# Patient Record
Sex: Male | Born: 1945 | Race: White | Hispanic: No | Marital: Married | Smoking: Former smoker
Health system: Southern US, Community
[De-identification: ages and names within clinical notes are randomized; demographics above are authoritative.]

## PROBLEM LIST (undated history)

## (undated) DIAGNOSIS — K5900 Constipation, unspecified: Secondary | ICD-10-CM

## (undated) DIAGNOSIS — K449 Diaphragmatic hernia without obstruction or gangrene: Secondary | ICD-10-CM

## (undated) DIAGNOSIS — L918 Other hypertrophic disorders of the skin: Secondary | ICD-10-CM

## (undated) DIAGNOSIS — E785 Hyperlipidemia, unspecified: Secondary | ICD-10-CM

## (undated) DIAGNOSIS — G4733 Obstructive sleep apnea (adult) (pediatric): Secondary | ICD-10-CM

## (undated) DIAGNOSIS — I10 Essential (primary) hypertension: Secondary | ICD-10-CM

## (undated) DIAGNOSIS — N400 Enlarged prostate without lower urinary tract symptoms: Secondary | ICD-10-CM

## (undated) DIAGNOSIS — R7303 Prediabetes: Secondary | ICD-10-CM

## (undated) DIAGNOSIS — M1710 Unilateral primary osteoarthritis, unspecified knee: Secondary | ICD-10-CM

## (undated) DIAGNOSIS — E118 Type 2 diabetes mellitus with unspecified complications: Secondary | ICD-10-CM

## (undated) DIAGNOSIS — R269 Unspecified abnormalities of gait and mobility: Principal | ICD-10-CM

## (undated) DIAGNOSIS — M545 Low back pain: Secondary | ICD-10-CM

## (undated) DIAGNOSIS — R270 Ataxia, unspecified: Secondary | ICD-10-CM

## (undated) DIAGNOSIS — K648 Other hemorrhoids: Secondary | ICD-10-CM

## (undated) DIAGNOSIS — M1990 Unspecified osteoarthritis, unspecified site: Secondary | ICD-10-CM

## (undated) DIAGNOSIS — L84 Corns and callosities: Secondary | ICD-10-CM

## (undated) DIAGNOSIS — F411 Generalized anxiety disorder: Secondary | ICD-10-CM

## (undated) DIAGNOSIS — R413 Other amnesia: Secondary | ICD-10-CM

## (undated) DIAGNOSIS — D179 Benign lipomatous neoplasm, unspecified: Secondary | ICD-10-CM

## (undated) DIAGNOSIS — B079 Viral wart, unspecified: Secondary | ICD-10-CM

## (undated) DIAGNOSIS — IMO0002 Reserved for concepts with insufficient information to code with codable children: Secondary | ICD-10-CM

## (undated) HISTORY — DX: Low back pain: M54.5

## (undated) HISTORY — DX: Constipation, unspecified: K59.00

## (undated) HISTORY — DX: Viral wart, unspecified: B07.9

## (undated) HISTORY — PX: TOTAL KNEE ARTHROPLASTY: SHX125

## (undated) HISTORY — DX: Unspecified osteoarthritis, unspecified site: M19.90

## (undated) HISTORY — DX: Corns and callosities: L84

## (undated) HISTORY — DX: Obstructive sleep apnea (adult) (pediatric): G47.33

## (undated) HISTORY — DX: Essential (primary) hypertension: I10

## (undated) HISTORY — DX: Unilateral primary osteoarthritis, unspecified knee: M17.10

## (undated) HISTORY — DX: Other amnesia: R41.3

## (undated) HISTORY — DX: Type 2 diabetes mellitus with unspecified complications: E11.8

## (undated) HISTORY — DX: Generalized anxiety disorder: F41.1

## (undated) HISTORY — PX: JOINT REPLACEMENT: SHX530

## (undated) HISTORY — DX: Prediabetes: R73.03

## (undated) HISTORY — DX: Reserved for concepts with insufficient information to code with codable children: IMO0002

## (undated) HISTORY — DX: Diaphragmatic hernia without obstruction or gangrene: K44.9

## (undated) HISTORY — DX: Other hemorrhoids: K64.8

## (undated) HISTORY — DX: Unspecified abnormalities of gait and mobility: R26.9

## (undated) HISTORY — DX: Hyperlipidemia, unspecified: E78.5

## (undated) HISTORY — PX: ROTATOR CUFF REPAIR: SHX139

## (undated) HISTORY — DX: Ataxia, unspecified: R27.0

## (undated) HISTORY — DX: Benign lipomatous neoplasm, unspecified: D17.9

## (undated) HISTORY — PX: KNEE SURGERY: SHX244

## (undated) HISTORY — DX: Other hypertrophic disorders of the skin: L91.8

## (undated) HISTORY — DX: Benign prostatic hyperplasia without lower urinary tract symptoms: N40.0

---
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
## 1997-10-23 ENCOUNTER — Encounter: Payer: Self-pay | Admitting: Internal Medicine

## 1998-03-31 ENCOUNTER — Other Ambulatory Visit: Admission: RE | Admit: 1998-03-31 | Discharge: 1998-03-31 | Payer: Self-pay | Admitting: *Deleted

## 1998-06-11 ENCOUNTER — Encounter: Payer: Self-pay | Admitting: Pulmonary Disease

## 1998-06-19 ENCOUNTER — Encounter: Payer: Self-pay | Admitting: Pulmonary Disease

## 1998-12-01 ENCOUNTER — Ambulatory Visit: Admission: RE | Admit: 1998-12-01 | Discharge: 1998-12-01 | Payer: Self-pay | Admitting: Pulmonary Disease

## 1998-12-01 ENCOUNTER — Encounter: Payer: Self-pay | Admitting: Pulmonary Disease

## 1998-12-23 ENCOUNTER — Encounter: Admission: RE | Admit: 1998-12-23 | Discharge: 1999-03-23 | Payer: Self-pay

## 1999-11-23 HISTORY — PX: NASAL SEPTUM SURGERY: SHX37

## 2000-11-30 ENCOUNTER — Encounter: Admission: RE | Admit: 2000-11-30 | Discharge: 2001-02-28 | Payer: Self-pay | Admitting: Psychology

## 2001-11-07 ENCOUNTER — Encounter: Admission: RE | Admit: 2001-11-07 | Discharge: 2002-02-05 | Payer: Self-pay | Admitting: Orthopedic Surgery

## 2002-04-10 ENCOUNTER — Encounter: Payer: Self-pay | Admitting: Orthopedic Surgery

## 2002-04-13 ENCOUNTER — Inpatient Hospital Stay (HOSPITAL_COMMUNITY): Admission: RE | Admit: 2002-04-13 | Discharge: 2002-04-17 | Payer: Self-pay | Admitting: Orthopedic Surgery

## 2003-02-25 ENCOUNTER — Inpatient Hospital Stay (HOSPITAL_COMMUNITY): Admission: RE | Admit: 2003-02-25 | Discharge: 2003-03-01 | Payer: Self-pay | Admitting: Orthopedic Surgery

## 2004-11-22 HISTORY — PX: TESTICLE REMOVAL: SHX68

## 2005-08-27 ENCOUNTER — Ambulatory Visit (HOSPITAL_COMMUNITY): Admission: RE | Admit: 2005-08-27 | Discharge: 2005-08-27 | Payer: Self-pay | Admitting: Urology

## 2005-08-27 ENCOUNTER — Encounter (INDEPENDENT_AMBULATORY_CARE_PROVIDER_SITE_OTHER): Payer: Self-pay | Admitting: Specialist

## 2006-07-14 HISTORY — PX: COLONOSCOPY: SHX174

## 2008-05-22 ENCOUNTER — Emergency Department (HOSPITAL_COMMUNITY): Admission: EM | Admit: 2008-05-22 | Discharge: 2008-05-22 | Payer: Self-pay | Admitting: Emergency Medicine

## 2009-11-03 ENCOUNTER — Telehealth (INDEPENDENT_AMBULATORY_CARE_PROVIDER_SITE_OTHER): Payer: Self-pay | Admitting: *Deleted

## 2009-11-05 DIAGNOSIS — G4733 Obstructive sleep apnea (adult) (pediatric): Secondary | ICD-10-CM | POA: Insufficient documentation

## 2009-11-05 HISTORY — DX: Obstructive sleep apnea (adult) (pediatric): G47.33

## 2009-11-11 ENCOUNTER — Ambulatory Visit: Payer: Self-pay | Admitting: Pulmonary Disease

## 2009-11-11 DIAGNOSIS — M129 Arthropathy, unspecified: Secondary | ICD-10-CM | POA: Insufficient documentation

## 2009-12-09 ENCOUNTER — Ambulatory Visit (HOSPITAL_BASED_OUTPATIENT_CLINIC_OR_DEPARTMENT_OTHER): Admission: RE | Admit: 2009-12-09 | Discharge: 2009-12-09 | Payer: Self-pay | Admitting: Orthopedic Surgery

## 2010-12-24 NOTE — Consult Note (Signed)
Summary: Sleep Med Consult/[facility]  Sleep Med Consult/[facility]   Imported By: Sherian Rein 11/11/2009 13:59:40  _____________________________________________________________________  External Attachment:    Type:   Image     Comment:   External Document

## 2010-12-24 NOTE — Letter (Signed)
Summary: [facility]  [facility]   Imported By: Sherian Rein 11/11/2009 13:57:10  _____________________________________________________________________  External Attachment:    Type:   Image     Comment:   External Document

## 2010-12-24 NOTE — Assessment & Plan Note (Signed)
Summary: rov for osa   Copy to:  Former Pt Primary Provider/Referring Provider:  Murray Hodgkins  CC:  Sleep Consult.  History of Present Illness: The pt comes in today for f/u of his known osa.  He has not been seen since 1999, but has remained compliant on his cpap.  He got a new cpap machine approx. 4-27yrs ago.  He has kept up with his mask changes, but is due for a new mask.  He is sleeping 8-9hrs a day with cpap, and awakens refreshed.  He is satisfied with his daytime alertness.  His weight has remained neutral since his last visit.  Preventive Screening-Counseling & Management  Alcohol-Tobacco     Smoking Status: quit  Current Medications (verified): 1)  No Medications  Allergies (verified): 1)  ! Codeine  Past History:  Past Medical History: Current Problems:  ARTHRITIS (ICD-716.90) OBSTRUCTIVE SLEEP APNEA (ICD-327.23)    Family History: allergies: sister asthma: sister heart disease: mother, brother, paternal grandmother clotting disorders: maternal grandmother (stroke) cancer: sister (breast), mother (breast), paternal grandmother (breast)   Social History: Patient states former smoker.  started at age 82.  2 ppd.  quit 1966. pt is married. pt has children. pt is retired.  prev worked for the [employer] x 37 years.  Smoking Status:  quit  Review of Systems       The patient complains of anxiety, hand/feet swelling, and joint stiffness or pain.  The patient denies shortness of breath with activity, shortness of breath at rest, productive cough, non-productive cough, coughing up blood, chest pain, irregular heartbeats, acid heartburn, indigestion, loss of appetite, weight change, abdominal pain, difficulty swallowing, sore throat, tooth/dental problems, headaches, nasal congestion/difficulty breathing through nose, sneezing, itching, ear ache, depression, rash, change in color of mucus, and fever.    Vital Signs:  Patient profile:   65 year old male Height:       66 inches Weight:      250.13 pounds BMI:     40.52 O2 Sat:      98 % on Room air Temp:     97.9 degrees F oral Pulse rate:   85 / minute BP sitting:   122 / 84  (right arm) Cuff size:   large  Vitals Entered By: Arman Filter LPN (November 11, 2009 11:28 AM)  O2 Flow:  Room air CC: Sleep Consult Comments Medications reviewed with patient Arman Filter LPN  November 11, 2009 11:41 AM    Physical Exam  General:  ow male in nad Nose:  no skin breakdown or pressure necrosis from cpap mask Extremities:  minimal edema, but no cyanosis Neurologic:  alert, does not appear sleepy, and moves all 4.   Impression & Recommendations:  Problem # 1:  OBSTRUCTIVE SLEEP APNEA (ICD-327.23) The pt has a history of moderate osa, and is doing well on cpap.  I have stressed to him the importance of keeping up with supplies and mask changes, and he will be due for a new machine in next 1-2 years.  I have also reminded him of the importance of weight loss.  Medications Added to Medication List This Visit: 1)  No Medications   Other Orders: Est. Patient Level III (62130)  Patient Instructions: 1)  no change in cpap 2)  work on weight loss 3)  followup with me in 12mos.

## 2011-02-07 LAB — POCT HEMOGLOBIN-HEMACUE: Hemoglobin: 15 g/dL (ref 13.0–17.0)

## 2011-03-10 ENCOUNTER — Encounter: Payer: Self-pay | Admitting: Pulmonary Disease

## 2011-03-12 ENCOUNTER — Ambulatory Visit (INDEPENDENT_AMBULATORY_CARE_PROVIDER_SITE_OTHER): Payer: PRIVATE HEALTH INSURANCE | Admitting: Pulmonary Disease

## 2011-03-12 ENCOUNTER — Encounter: Payer: Self-pay | Admitting: Pulmonary Disease

## 2011-03-12 VITALS — BP 120/80 | HR 74 | Temp 98.0°F | Ht 66.0 in | Wt 252.0 lb

## 2011-03-12 DIAGNOSIS — G4733 Obstructive sleep apnea (adult) (pediatric): Secondary | ICD-10-CM

## 2011-03-12 NOTE — Progress Notes (Signed)
  Subjective:    Patient ID: Jacob Heath, male    DOB: 1946/04/20, 65 y.o.   MRN: [id number]  HPI The pt comes in today for f/u of his known osa.  He is wearing cpap compliantly, and has no issues with his mask fit or pressure.  He is due for a new mask and headgear.  He is sleeping well, and denies EDS.  His weight is stable since last visit.    Review of Systems  Constitutional: Negative for fever and unexpected weight change.  HENT: Positive for rhinorrhea, sneezing and sinus pressure. Negative for ear pain, nosebleeds, congestion, sore throat, trouble swallowing, dental problem and postnasal drip.   Eyes: Negative for redness and itching.  Respiratory: Positive for cough. Negative for chest tightness, shortness of breath and wheezing.   Cardiovascular: Negative for palpitations and leg swelling.  Gastrointestinal: Negative for nausea and vomiting.  Genitourinary: Negative for dysuria.  Musculoskeletal: Positive for joint swelling.  Skin: Negative for rash.  Neurological: Negative for headaches.  Hematological: Does not bruise/bleed easily.  Psychiatric/Behavioral: Negative for dysphoric mood. The patient is nervous/anxious.        Objective:   Physical Exam Obese male in nad No skin breakdown or pressure necrosis from cpap mask  No LE edema, no cyanosis  Alert and oriented, moves all 4  Does not appear sleepy       Assessment & Plan:

## 2011-03-12 NOTE — Patient Instructions (Signed)
Will get you new mask and headgear Work on weight loss followup with me in one year.

## 2011-03-12 NOTE — Assessment & Plan Note (Signed)
The pt is doing very well with cpap, and feels he is sleeping well with good daytime alertness.  He is due for new mask and supplies.  I have also encouraged him to work on weight loss.  He will see me back in one year.

## 2011-04-09 NOTE — Discharge Summary (Signed)
[place]  Patient:    Jacob Heath, Jacob Heath Visit Number: [id number] MRN: [id number]          Service Type: SUR Location: 5000 5029 01 Attending Physician:  Twana First Dictated by:   Julien Girt, P.A. Admit Date:  04/13/2002 Discharge Date: 04/17/2002                             Discharge Summary  ADMITTING DIAGNOSES: 1. End-stage degenerative joint disease, left knee. 2. Sleep apnea.  DISCHARGE DIAGNOSES: 1. End-stage degenerative joint disease, left knee. 2. Sleep apnea.  HISTORY OF PRESENT ILLNESS:  The patient is a 65 year old male with a history of end-stage DJD of his left knee.  He has tried anti-inflammatories, pain medicine and cortisone injections, all without success.  At this point in time, he has pain at night, pain with rest, pain with any activity.  He understands the risks, benefits and possible complications of a left total knee replacement.  PROCEDURES IN HOUSE:  On Apr 13, 2002, the patient underwent a left total knee replacement by Dr. Elana Alm. Wainer; he tolerated the procedure well.  HOSPITAL COURSE:  He was admitted postoperatively for pain control, physical therapy and DVT prophylaxis.  Postop day 0, Coumadin was started.  Postop day 1, surgical wound was well-approximated.  he had a moderate amount of drainage.  Hemoglobin was 11.0, INR was 1.1.  He was metabolically stable. T-max was 100.3.  His Foley was discontinued.  His drain was discontinued. Dressing was changed.  His PCA was discontinued and Mepergan was started. Postop day 2, blood pressure 144/76, O2 saturation 96, surgical wound well-approximated, CPM 0 to 75, hemoglobin 10.9, progressing well with physical therapy.  Postop day 3, T-max was 100.6, pulse of 100, hemoglobin 10.3, surgical wound well-approximated, calf was soft, he was metabolically stable.  Postop day 4, patient doing great, no complaints, surgical wound  is well-approximated, he was 0 to 90 on his CPM, minimal assist to supervision and physical therapy.  He was discharged to home in stable condition, weightbearing as tolerated.  DISCHARGE MEDICATIONS:  Mepergan for pain, Coumadin for DVT prophylaxis and Senokot two tablets q.h.s. for constipation and Colace 100 mg twice a day as a stool softener.  FOLLOWUP:  We will see him back in the office in a week and a half for staples out and an x-ray.  DIET:  He is on a regular diet.  ACTIVITY:  Weightbearing as tolerated.  SPECIAL DISCHARGE INSTRUCTIONS:  Will call us with fever of greater than 101, warmth, redness or increased pain. Dictated by:   Julien Girt, P.A. Attending Physician:  Twana First DD:  05/01/02 TD:  05/03/02 Job: [id number]

## 2011-04-09 NOTE — Op Note (Signed)
[place]  Patient:    Jacob Heath, Jacob Heath Visit Number: [id number] MRN: [id number]          Service Type: SUR Location: 5000 5029 01 Attending Physician:  Twana First Dictated by:   Elana Alm Thurston Hole, M.D. Proc. Date: 04/13/02 Admit Date:  04/13/2002                             Operative Report  PREOPERATIVE DIAGNOSIS: Left knee degenerative joint disease.  POSTOPERATIVE DIAGNOSIS: Left knee degenerative joint disease.  OPERATION/PROCEDURE:  1. Left total knee replacement using [employer] Scorpio total knee system with     #9 cemented component, #9 cemented tibial component, with 15 mm     polyethylene tibial spacer and 26 mm polyethylene cemented patella.  2. Left knee lateral retinacular release.  SURGEON: Elana Alm. Thurston Hole, M.D.  ASSISTANT: Julien Girt, P.A.  ANESTHESIA: General.  OPERATIVE TIME: One hour and 45 minutes.  COMPLICATIONS: None.  DESCRIPTION OF PROCEDURE: Mr. Goldammer was brought to the operating room on Apr 13, 2002 and placed on the operating room table in the supine position.  After an adequate level of general anesthesia was obtained his left knee was examined under anesthesia.  Range of motion was from 0-125 degrees with moderate varus deformity, knee stable ligamentous examination with normal patella tracking.  He had a Foley catheter placed under sterile conditions and received Ancef 1 g preoperatively for prophylaxis.  The left leg was prepped using sterile DuraPrep and draped using sterile technique.  The leg was exsanguinated and a tourniquet elevated to 375 mm Hg.  Initially through a 20 cm longitudinal incision based over the patella initial exposure was made. The underlying subcutaneous tissues were incised in line with the skin incision.  A medial arthrotomy was performed revealing an excessive amount of normal appearing joint fluid.  The articular surfaces were inspected.  He had grade IV changes  medially and grade III and IV changes laterally and in the patellofemoral joint.  Osteophytes were removed from the femoral condyles and tibial plateau.  The medial and lateral meniscal remnants were removed as well as the anterior cruciate ligament.  An intramedullary drill was then drilled up the proximal femoral canal and up the distal femoral canal for placement of the distal femoral cutting jig, which was placed in the appropriate amount of rotation, and then a distal 12 mm cut was made.  The distal femur was incised. A #9 was felt to be the appropriate size and a #9 cutting jig placed and then these cuts were made.  After this was done the proximal tibia was exposed. The tibial spines were removed with an oscillating saw.  The intramedullary drill was drilled down the tibial canal for placement of the proximal tibial cutting jig, which was placed in the appropriate amount of rotation and a proximal 4 mm cut was made based off the medial or lower side.  After this was done then the Scorpio PCL cutter was placed back on the distal femur and these cuts were made.  The #9 femoral trial was then placed and hammered into position.  The #9 tibial base plate with a 15 mm polyethylene spacer was placed and the knee taken through range of motion 0-120 degrees with excellent stability and excellent restoration of normal alignment, and no lift-off on the tray.  The tibial base plate was then locked for rotation and then the [facility] cut  was made.  After this was done then the patella was sized.  A 26 mm was found to be the appropriate size and a recessed 10 mm x 26 mm cut was made and three locking holes placed.  After this was done it was felt that all trial components were of excellent size, fit, and stability.  The knee was then gently lavage irrigated with 3 liters of saline solution.  The proximal tibia was then exposed and the #9 tibial base plate with cement backing was hammered into position  with an excellent fit, with excess cement being removed from around the edges.  The #9 femoral component with cement backing was hammered into position, also with an excellent fit, with excess cement being removed from the around the edges.  The 15 mm polyethylene spacer was locked onto the tibial base plate and the knee taken through range of motion 0-120 degrees with excellent stability and excellent alignment, and no lift-off on the tray.  The 26 mm polyethylene patella with cement backing was then placed into each recessed hole and held there with a clamp.  After the cement hardened patellofemoral tracking was evaluated.  There was some excessive lateral tightness to the patella tracking and thus a Altace retinacular release was carried out, improving this and improving patella tracking to normal.  At this point it was felt that all the components were of excellent size, fit, and stability.  The knee was irrigated with antibiotic solution and then the arthrotomy was closed with #1 Ethibond suture over two medium Hemovac drains.  Subcutaneous tissue was closed with 0 and 2-0 Vicryl.  Skin was closed with skin staples.  Sterile dressings were applied.  The Hemovac was injected with 0.25% Marcaine with epinephrine and clamped.  The tourniquet was released.  The patient then had a femoral nerve block placed by anesthesia for postoperative pain control.  He was then awakened and taken to the recovery room in stable condition.  Needle and sponge counts were correct x2 at the end of the case.  Neurovascular status and pulses were 2+ and symmetric. Dictated by:   Elana Alm Thurston Hole, M.D. Attending Physician:  Twana First DD:  04/13/02 TD:  04/14/02 Job: [id number]

## 2011-04-09 NOTE — Discharge Summary (Signed)
   NAME:  Jacob Heath, Jacob Heath                          ACCOUNT NO.:  [id number]   MEDICAL RECORD NO.:  [id number]                   PATIENT TYPE:  INP   LOCATION:  5039                                 FACILITY:  [facility]   PHYSICIAN:  Robert A. Thurston Hole, M.D.              DATE OF BIRTH:  May 17, 1946   DATE OF ADMISSION:  02/25/2003  DATE OF DISCHARGE:  03/01/2003                                 DISCHARGE SUMMARY   ADMITTING DIAGNOSES:  1. End-stage degenerative joint disease, right knee.  2. Sleep apnea.   DISCHARGE DIAGNOSES:  1. End-stage degenerative joint disease, right knee.  2. Sleep apnea.   PROCEDURES IN HOUSE:  On February 25, 2003, the patient underwent a total knee  replacement by Dr. Elana Alm. Wainer.  He tolerated the procedure well.   HOSPITAL COURSE:  He was admitted postoperatively for pain control, DVT  prophylaxis and physical therapy.  Postop day 1, vital signs are stable, T-  max is 101, hemoglobin was 12.5, BMET was within normal limits, range of  motion was 0 to 50 on the CPM, surgical wound was well-approximated.  His  drain was removed.  CPM was encouraged, as was incentive spirometry.  Postop  day 2, the patient continued to improve without complaint, T-max of 101.3,  hemoglobin was 11.8, surgical wound was well-approximated, he was  neurovascularly intact.  The patient ambulated 25 feet.  Postop day 3, the  patient was ambulating but did lose his balance.  T-max was 101, blood  pressure was 122/56, pulse 102.  We encouraged physical therapy and  continued mobilization.  Postop day 4, the patient was weightbearing as  tolerated, ambulated 100 feet and was discharged to home in stable  condition.   DISCHARGE MEDICATIONS:  1. Coumadin 5 mg one p.o. daily.  2. Mepergan Fortis one to two q.4-6h. p.r.n. pain.   FOLLOWUP:  He will follow up with Dr. Thurston Hole in two weeks.   ACTIVITY AND DIET:  He is weightbearing as tolerated and on a regular diet.     Kirstin  Shepperson, P.A.                  Robert A. Thurston Hole, M.D.    KS/MEDQ  D:  04/17/2003  T:  04/18/2003  Job:  [id number]

## 2011-04-09 NOTE — Op Note (Signed)
Jacob Heath, Jacob Heath                ACCOUNT NO.:  [id number]   MEDICAL RECORD NO.:  [id number]          PATIENT TYPE:  AMB   LOCATION:  DAY                          FACILITY[facility]  [facility]   PHYSICIAN:  Sigmund I. Patsi Sears, M.D.DATE OF BIRTH:  [birth year]   DATE OF PROCEDURE:  08/27/2005  DATE OF DISCHARGE:                                 OPERATIVE REPORT   PREOPERATIVE DIAGNOSIS:  Right intratesticular cyst.   POSTOPERATIVE DIAGNOSIS:  Right intratesticular cyst.   PROCEDURE:  Right orchiectomy.   SURGEON:  Sigmund I. Patsi Sears, M.D.   ASSISTANT:  Glade Nurse, M.D.   ANESTHESIA:  General endotracheal.   SPECIMEN:  Right testis and cord.   DESCRIPTION OF PROCEDURE:  The patient was identified by his wrist bracelet  and brought to room 10 where he received preoperative antibiotics and was  administered general anesthesia. He was prepped and draped in the usual  sterile fashion taking care to minimize peripheral neuropathy and  compartment syndrome. Next, we made a 4 cm right subinguinal incision. We  dissected down through the dermis fat and Scarpa's fascia with Bovie  cautery. Retractors were set, the external ring was identified and the cord  structure was cleared of overlying fat with a Kitner. Next, a right angled  clamp was passed behind the cord structure and the cord was delivered into  the field. A Penrose drain was passed around the cord structure to occlude  and secure the cord structure. Next, by providing upward traction on the  right hemiscrotum, the testis was delivered into the field. The gubernaculum  was divided with sharp dissection and Bovie cautery. Next we dissected off  the attachments to the cremaster as far as the external ring bluntly. The  cord structure was divided into 2 packets using a right angle clamp and each  pack was ligated with a 2-0 Prolene stick tie as well as with 2-0 Prolene  free tie. The cord was divided with heavy scissors and the testis  and cord  was passed off the field for pathologic analysis. A frozen section was  obtained and returned as benign cystic changes. Therefore we did not open  the external ring to remove further cord structure. Next, the wound was  copiously irrigated, hemostasis was ensured. We then closed Scarpa's fascia  with a running 3-0 Vicryl and skin was  closed with a running 4-0 subcuticular Monocryl. Dressings were applied.  Scrotal support was applied. The patient was reversed from his anesthesia  which he tolerated without complication. Please note Sigmund I. Patsi Sears,  M.D. was present and participated in all aspects of this case.     ______________________________  Glade Nurse, MD      Sigmund I. Patsi Sears, M.D.  Electronically Signed    MT/MEDQ  D:  08/27/2005  T:  08/27/2005  Job:  [id number]

## 2011-04-09 NOTE — Op Note (Signed)
NAME:  Jacob Heath, Jacob Heath                          ACCOUNT NO.:  [id number]   MEDICAL RECORD NO.:  [id number]                   PATIENT TYPE:  INP   LOCATION:  NA                                   FACILITY:  [facility]   PHYSICIAN:  Robert A. Thurston Hole, M.D.              DATE OF BIRTH:  1946/02/02   DATE OF PROCEDURE:  02/25/2003  DATE OF DISCHARGE:                                 OPERATIVE REPORT   PREOPERATIVE DIAGNOSIS:  Right knee degenerative joint disease.   POSTOPERATIVE DIAGNOSIS:  Right knee degenerative joint disease.   PROCEDURES:  1. Right total knee replacement using [employer] Scorpio total knee system     with #9 cemented femoral component, #7 cemented tibial component with 15     mm polyethylene tibial spacer, and 26 mm polyethylene cemented patella.  2. Right knee lateral retinacular release.   SURGEON:  Elana Alm. Thurston Hole, M.D.   ASSISTANT:  Julien Girt, P.A.   ANESTHESIA:  General.   OPERATIVE TIME:  1 hour and 20 minutes.   COMPLICATIONS:  None.   DESCRIPTION OF PROCEDURE:  Mr. Bua was brought to the operating room on  02/25/03 and placed on the operative table in the supine position.  After an  adequate level of general anesthesia was obtained, he had a Foley catheter  placed under sterile conditions and received Ancef 1 gm IV preoperatively  for prophylaxis.  His right knee was examined under anesthesia.  Range of  motion from 0 to 120 degrees with mild varus deformity.  Knee stable during  this exam.  The right knee and leg was prepped using sterile DuraPrep and  draped using sterile technique.  Leg was exsanguinated with thigh tourniquet  elevated to 375 mm.  Initially, a 20 cm longitudinal incision placed over  the patella.  Initial exposure was made.  The underlying subcutaneous  tissues were incised, along with skin incision.  A median arthrotomy was  performed revealing an excessive amount of normal-appearing joint fluid.  The articular substances were  inspected.  He had grade 4 changes medially  and grade 3 changes laterally; grade 3 and 4 changes in the patellofemoral  joint.  Osteophytes were removed from the femoral condyles and tibial  plateau.  The medial and lateral meniscal remnants and the anterior cruciate  ligament was removed, as well.  An intramedullary drill was then drilled up  the femoral canal for placement of the distal femoral cutting jig which was  placed in the appropriate amount of rotation and a distal 10 mm cut was  made.  The distal femur was sized.  A #9 was found to be the appropriate  size, and a #9 cutting jig was placed, and these cuts were made.  After this  was done, the proximal tibia was exposed.  The tibial spines were moved with  an oscillating saw.  The intramedullary drill drilled down the tibial  canal  for placement of the proximal tibial cutting jig, and a 6 mm cut was made  off the proximal medial side.  After this cut was made, then the Scorpio PCL  cutter was placed back on the distal femur and these cuts were made.  At  this point, the femoral #9 trial was placed.  The #7 tibial base plate trial  was placed, and with a 15 mm polyethylene spacer, the knee was taken through  a full range of motion and found to be stable, and the alignment corrected  to normal.  The tibial base plate was then marked for rotation, and a keel  cut was made.  After this was done, the patella was sized; 26 mm was found  to be the appropriate size, and the recessed 10 mm x 26 mm cut was made and  the three locking holes were placed.  After this was done, it was felt that  all the trial components were of excellent size, fit, and stability, and  they were then removed.  The knee was jet lavage irrigated with three liters  of saline solution.  The proximal tibial was then exposed, and the #7 tibial  base plate with cement backing was hammered into position with an excellent  fit with excess cement being removed from around  the edges.  The #9 femoral  component with cement backing was hammered into position also with an  excellent fit with excess cement being removed from around the edges.  The  15 mm polyethylene flexed tibial spacer was locked onto the tibial base  plate.  The knee was taken through a full range of motion and found to be  stable.  The 26 mm patella was locked in this recessed hole with cement  backing as well.  After the cement hardened, patellofemoral tracking was  evaluated.  There still was some significant loud patella tracking; thus  lateral retinacular release was carried out, improving the patella tracking  to normal.  After this was done, it was felt that left lower the components  were of excellent size, fit, and stability.  The knee was further irrigated  with antibiotic solution.  The arthrotomy was then closed with #1 Ethilon  suture over two medium Hemovac drains.  The subcutaneous tissue was closed  with 0 and 2-0 Vicryl.  The skin was closed with skin staples.  Sterile  dressings were applied.  The Hemovac was injected with 0.25% Marcaine with  epinephrine and 4 mg of morphine and clamped.  The tourniquet was released.  The patient then had a femoral nerve block placed by anesthesia for  postoperative pain control.  He was then awakened, extubated, and taken to  the recovery room in stable condition.  Needle and sponge counts correct x2  at the end of the case.                                               Robert A. Thurston Hole, M.D.    RAW/MEDQ  D:  02/25/2003  T:  02/25/2003  Job:  [id number]

## 2012-03-08 LAB — HM COLONOSCOPY

## 2013-12-28 ENCOUNTER — Encounter: Payer: Self-pay | Admitting: *Deleted

## 2014-01-01 ENCOUNTER — Ambulatory Visit (INDEPENDENT_AMBULATORY_CARE_PROVIDER_SITE_OTHER): Payer: PRIVATE HEALTH INSURANCE | Admitting: Internal Medicine

## 2014-01-01 ENCOUNTER — Encounter: Payer: Self-pay | Admitting: Internal Medicine

## 2014-01-01 VITALS — BP 124/80 | HR 84 | Temp 98.1°F | Ht 66.5 in | Wt 233.0 lb

## 2014-01-01 DIAGNOSIS — K5900 Constipation, unspecified: Secondary | ICD-10-CM | POA: Insufficient documentation

## 2014-01-01 DIAGNOSIS — Z23 Encounter for immunization: Secondary | ICD-10-CM

## 2014-01-01 DIAGNOSIS — F411 Generalized anxiety disorder: Secondary | ICD-10-CM

## 2014-01-01 DIAGNOSIS — M545 Low back pain, unspecified: Secondary | ICD-10-CM

## 2014-01-01 DIAGNOSIS — L918 Other hypertrophic disorders of the skin: Secondary | ICD-10-CM

## 2014-01-01 DIAGNOSIS — L919 Hypertrophic disorder of the skin, unspecified: Secondary | ICD-10-CM

## 2014-01-01 DIAGNOSIS — D179 Benign lipomatous neoplasm, unspecified: Secondary | ICD-10-CM

## 2014-01-01 DIAGNOSIS — G4733 Obstructive sleep apnea (adult) (pediatric): Secondary | ICD-10-CM

## 2014-01-01 DIAGNOSIS — L84 Corns and callosities: Secondary | ICD-10-CM

## 2014-01-01 DIAGNOSIS — I10 Essential (primary) hypertension: Secondary | ICD-10-CM

## 2014-01-01 DIAGNOSIS — R269 Unspecified abnormalities of gait and mobility: Secondary | ICD-10-CM | POA: Insufficient documentation

## 2014-01-01 DIAGNOSIS — R279 Unspecified lack of coordination: Secondary | ICD-10-CM

## 2014-01-01 DIAGNOSIS — R270 Ataxia, unspecified: Secondary | ICD-10-CM

## 2014-01-01 DIAGNOSIS — L909 Atrophic disorder of skin, unspecified: Secondary | ICD-10-CM

## 2014-01-01 DIAGNOSIS — R413 Other amnesia: Secondary | ICD-10-CM | POA: Insufficient documentation

## 2014-01-01 DIAGNOSIS — B079 Viral wart, unspecified: Secondary | ICD-10-CM

## 2014-01-01 HISTORY — DX: Benign lipomatous neoplasm, unspecified: D17.9

## 2014-01-01 HISTORY — DX: Low back pain, unspecified: M54.50

## 2014-01-01 HISTORY — DX: Other hypertrophic disorders of the skin: L91.8

## 2014-01-01 HISTORY — DX: Ataxia, unspecified: R27.0

## 2014-01-01 MED ORDER — ZOSTER VACCINE LIVE 19400 UNT/0.65ML ~~LOC~~ SOLR: 0.6500 mL | Freq: Once | SUBCUTANEOUS | Status: DC

## 2014-01-01 NOTE — Patient Instructions (Signed)
Try Delsym for cough 
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
## 2014-01-02 LAB — CBC WITH DIFFERENTIAL
Basophils Absolute: 0 10*3/uL (ref 0.0–0.2)
Basos: 0 %
Eos: 1 %
Eosinophils Absolute: 0.1 10*3/uL (ref 0.0–0.4)
HCT: 39.9 % (ref 37.5–51.0)
Hemoglobin: 13.5 g/dL (ref 12.6–17.7)
Immature Grans (Abs): 0 10*3/uL (ref 0.0–0.1)
Immature Granulocytes: 0 %
Lymphocytes Absolute: 2.3 10*3/uL (ref 0.7–3.1)
Lymphs: 31 %
MCH: 28.8 pg (ref 26.6–33.0)
MCHC: 33.8 g/dL (ref 31.5–35.7)
MCV: 85 fL (ref 79–97)
Monocytes Absolute: 0.7 10*3/uL (ref 0.1–0.9)
Monocytes: 9 %
Neutrophils Absolute: 4.5 10*3/uL (ref 1.4–7.0)
Neutrophils Relative %: 59 %
Platelets: 312 10*3/uL (ref 150–379)
RBC: 4.69 x10E6/uL (ref 4.14–5.80)
RDW: 15.1 % (ref 12.3–15.4)
WBC: 7.6 10*3/uL (ref 3.4–10.8)

## 2014-01-02 LAB — COMPREHENSIVE METABOLIC PANEL
ALT: 12 IU/L (ref 0–44)
AST: 16 IU/L (ref 0–40)
Albumin/Globulin Ratio: 1.5 (ref 1.1–2.5)
Albumin: 4.4 g/dL (ref 3.6–4.8)
Alkaline Phosphatase: 88 IU/L (ref 39–117)
BUN/Creatinine Ratio: 11 (ref 10–22)
BUN: 11 mg/dL (ref 8–27)
CO2: 23 mmol/L (ref 18–29)
Calcium: 9.3 mg/dL (ref 8.6–10.2)
Chloride: 101 mmol/L (ref 97–108)
Creatinine, Ser: 1 mg/dL (ref 0.76–1.27)
GFR calc Af Amer: 89 mL/min/{1.73_m2} (ref >59)
GFR calc non Af Amer: 77 mL/min/{1.73_m2} (ref >59)
Globulin, Total: 2.9 g/dL (ref 1.5–4.5)
Glucose: 99 mg/dL (ref 65–99)
Potassium: 4.3 mmol/L (ref 3.5–5.2)
Sodium: 140 mmol/L (ref 134–144)
Total Bilirubin: 0.4 mg/dL (ref 0.0–1.2)
Total Protein: 7.3 g/dL (ref 6.0–8.5)

## 2014-01-02 LAB — LIPID PANEL
Chol/HDL Ratio: 4.7 ratio units (ref 0.0–5.0)
Cholesterol, Total: 203 mg/dL — ABNORMAL HIGH (ref 100–199)
HDL: 43 mg/dL (ref >39)
LDL Calculated: 129 mg/dL — ABNORMAL HIGH (ref 0–99)
Triglycerides: 154 mg/dL — ABNORMAL HIGH (ref 0–149)
VLDL Cholesterol Cal: 31 mg/dL (ref 5–40)

## 2014-01-02 LAB — VITAMIN B12: Vitamin B-12: 351 pg/mL (ref 211–946)

## 2014-01-02 LAB — TSH: TSH: 1.88 u[IU]/mL (ref 0.450–4.500)

## 2014-01-03 ENCOUNTER — Encounter: Payer: Self-pay | Admitting: Internal Medicine

## 2014-01-03 DIAGNOSIS — B079 Viral wart, unspecified: Secondary | ICD-10-CM

## 2014-01-03 DIAGNOSIS — I10 Essential (primary) hypertension: Secondary | ICD-10-CM | POA: Insufficient documentation

## 2014-01-03 HISTORY — DX: Viral wart, unspecified: B07.9

## 2014-01-03 HISTORY — DX: Essential (primary) hypertension: I10

## 2014-01-10 ENCOUNTER — Ambulatory Visit (INDEPENDENT_AMBULATORY_CARE_PROVIDER_SITE_OTHER): Payer: PRIVATE HEALTH INSURANCE | Admitting: Neurology

## 2014-01-10 ENCOUNTER — Encounter: Payer: Self-pay | Admitting: Neurology

## 2014-01-10 DIAGNOSIS — R269 Unspecified abnormalities of gait and mobility: Secondary | ICD-10-CM
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
## 2014-01-25 ENCOUNTER — Ambulatory Visit
Admission: RE | Admit: 2014-01-25 | Discharge: 2014-01-25 | Disposition: A | Discharge status: Home / Self Care | Payer: PRIVATE HEALTH INSURANCE | Source: Ambulatory Visit | Attending: Neurology | Admitting: Neurology

## 2014-01-25 DIAGNOSIS — R269 Unspecified abnormalities of gait and mobility: Secondary | ICD-10-CM

## 2014-01-28 ENCOUNTER — Telehealth: Payer: Self-pay | Admitting: Neurology

## 2014-01-28 DIAGNOSIS — M545 Low back pain, unspecified: Secondary | ICD-10-CM

## 2014-01-28 DIAGNOSIS — R269 Unspecified abnormalities of gait and mobility: Secondary | ICD-10-CM

## 2014-01-28 NOTE — Telephone Encounter (Signed)
I have called Jacob Heath., MRI of the brain was essentially normal, MRI of cervical spine showed mild degenerative disc disease, but there was no cord compression, of findings would not explain his gradual worsening gait difficulty, we will proceed with MRI of thoracic, and lumbar spine,

## 2014-01-28 NOTE — Progress Notes (Signed)
Quick Note:  Please call patient, MRI of the brain showed age-related changes ______

## 2014-01-29 NOTE — Progress Notes (Signed)
Quick Note:  Left message with MRI brain results, age related changes, per Dr. Krista Blue. ______

## 2014-01-31 ENCOUNTER — Telehealth: Payer: Self-pay | Admitting: Neurology

## 2014-01-31 ENCOUNTER — Ambulatory Visit: Payer: PRIVATE HEALTH INSURANCE | Admitting: Neurology

## 2014-02-01 NOTE — Telephone Encounter (Signed)
Called patient and spoke with him we are still waiting on approval for MRI we cx his appt with Dr.Yan until he has MRI. Patient will call back and we will get him appt with Dr.Yan to go over MRI results.

## 2014-02-05 ENCOUNTER — Telehealth: Payer: Self-pay | Admitting: Neurology

## 2014-02-05 NOTE — Telephone Encounter (Signed)
Patient calling and is confused about when to schedule his next appointment with Dr. Krista Blue. Please call patient and advise.

## 2014-02-05 NOTE — Telephone Encounter (Signed)
Called to schedule an appt on 03/07/14 with Dr. Krista Blue, because patient is schedule to have MRI done on 02/09/14 and Dr. Krista Blue wanted to wait until the patient had his MRI done. I advised the patient that if he has any other problems, questions or concerns to call the office. Patient verbalized understanding.

## 2014-02-08 ENCOUNTER — Telehealth: Payer: Self-pay | Admitting: Neurology

## 2014-02-08 DIAGNOSIS — R269 Unspecified abnormalities of gait and mobility: Secondary | ICD-10-CM

## 2014-02-08 NOTE — Telephone Encounter (Signed)
Jessica: Yes it is okay to choline Xanax 1 mg as needed 3 tablets for MRI study

## 2014-02-08 NOTE — Telephone Encounter (Signed)
Pt called states he has taken 2 of the alprazolam and only has 1 left, pt wanted to know if he can get 1 more for tomorrow. Please call pt to confirm this matter. Thanks

## 2014-02-08 NOTE — Telephone Encounter (Signed)
Sorry I routed this to Triage and I meant to send it to refill, hopefully it goes to you thanks.

## 2014-02-08 NOTE — Telephone Encounter (Signed)
I spoke with the patient.  He would like to get another Rx for Xanax for MRI.  Says he lives over [place] miles away, so he would like the Rx called into [facility] if possible.  Okay to call in Rx?  Thank you.

## 2014-02-08 NOTE — Telephone Encounter (Signed)
Rx has been called in.  I spoke with the patient.  He is aware.

## 2014-02-09 ENCOUNTER — Ambulatory Visit
Admission: RE | Admit: 2014-02-09 | Discharge: 2014-02-09 | Disposition: A | Discharge status: Home / Self Care | Payer: PRIVATE HEALTH INSURANCE | Source: Ambulatory Visit | Attending: Neurology | Admitting: Neurology

## 2014-02-09 DIAGNOSIS — M545 Low back pain, unspecified: Secondary | ICD-10-CM

## 2014-02-09 DIAGNOSIS — R269 Unspecified abnormalities of gait and mobility: Secondary | ICD-10-CM

## 2014-03-07 ENCOUNTER — Encounter (INDEPENDENT_AMBULATORY_CARE_PROVIDER_SITE_OTHER): Payer: Self-pay

## 2014-03-07 ENCOUNTER — Encounter: Payer: Self-pay | Admitting: Neurology

## 2014-03-07 ENCOUNTER — Ambulatory Visit (INDEPENDENT_AMBULATORY_CARE_PROVIDER_SITE_OTHER): Payer: PRIVATE HEALTH INSURANCE | Admitting: Neurology

## 2014-03-07 VITALS — BP 115/75 | HR 70 | Ht 65.0 in | Wt 242.0 lb

## 2014-03-07 DIAGNOSIS — R269 Unspecified abnormalities of gait and mobility: Secondary | ICD-10-CM

## 2014-03-07 DIAGNOSIS — R1310 Dysphagia, unspecified: Secondary | ICD-10-CM | POA: Insufficient documentation
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
## 2014-03-08 NOTE — Progress Notes (Signed)
Quick Note:  Please call patient for normal laboratory result ______

## 2014-03-11 LAB — HIV ANTIBODY (ROUTINE TESTING W REFLEX)
HIV 1/O/2 Abs-Index Value: <1 (ref <1.00)
HIV-1/HIV-2 Ab: NONREACTIVE

## 2014-03-11 LAB — IFE AND PE, SERUM
Albumin SerPl Elph-Mcnc: 3.9 g/dL (ref 3.2–5.6)
Albumin/Glob SerPl: 1.2 (ref 0.7–2.0)
Alpha 1: 0.2 g/dL (ref 0.1–0.4)
Alpha2 Glob SerPl Elph-Mcnc: 0.8 g/dL (ref 0.4–1.2)
B-Globulin SerPl Elph-Mcnc: 1.2 g/dL (ref 0.6–1.3)
Gamma Glob SerPl Elph-Mcnc: 1.2 g/dL (ref 0.5–1.6)
Globulin, Total: 3.4 g/dL (ref 2.0–4.5)
IgA/Immunoglobulin A, Serum: 377 mg/dL (ref 91–414)
IgG (Immunoglobin G), Serum: 1336 mg/dL (ref 700–1600)
IgM (Immunoglobulin M), Srm: 40 mg/dL (ref 40–230)
Total Protein: 7.3 g/dL (ref 6.0–8.5)

## 2014-03-11 LAB — ANA W/REFLEX IF POSITIVE: Anti Nuclear Antibody(ANA): NEGATIVE

## 2014-03-11 LAB — SEDIMENTATION RATE: Sed Rate: 16 mm/hr (ref 0–30)

## 2014-03-11 LAB — FOLATE: Folate: 17.8 ng/mL (ref >3.0)

## 2014-03-11 LAB — RPR: RPR: NONREACTIVE

## 2014-03-11 LAB — COPPER, SERUM: Copper: 178 ug/dL — ABNORMAL HIGH (ref 72–166)

## 2014-03-11 LAB — LYME, TOTAL AB TEST/REFLEX: Lyme IgG/IgM Ab: <0.91 {ISR} (ref 0.00–0.90)

## 2014-03-11 LAB — CK: Total CK: 76 U/L (ref 24–204)

## 2014-03-19 ENCOUNTER — Ambulatory Visit (INDEPENDENT_AMBULATORY_CARE_PROVIDER_SITE_OTHER): Payer: PRIVATE HEALTH INSURANCE | Admitting: Neurology

## 2014-03-19 ENCOUNTER — Encounter (INDEPENDENT_AMBULATORY_CARE_PROVIDER_SITE_OTHER): Payer: Self-pay

## 2014-03-19 DIAGNOSIS — R269 Unspecified abnormalities of gait and mobility: Secondary | ICD-10-CM

## 2014-03-19 DIAGNOSIS — R270 Ataxia, unspecified: Secondary | ICD-10-CM

## 2014-03-19 DIAGNOSIS — M129 Arthropathy, unspecified: Secondary | ICD-10-CM

## 2014-03-19 DIAGNOSIS — K5900 Constipation, unspecified: Secondary | ICD-10-CM

## 2014-03-19 DIAGNOSIS — F411 Generalized anxiety disorder: Secondary | ICD-10-CM

## 2014-03-19 DIAGNOSIS — R279 Unspecified lack of coordination: Secondary | ICD-10-CM

## 2014-03-19 DIAGNOSIS — R413 Other amnesia: Secondary | ICD-10-CM

## 2014-03-19 DIAGNOSIS — Z0289 Encounter for other administrative examinations: Secondary | ICD-10-CM

## 2014-03-19 DIAGNOSIS — R1310 Dysphagia, unspecified: Secondary | ICD-10-CM

## 2014-03-19 NOTE — Procedures (Signed)
   NCS (NERVE CONDUCTION STUDY) WITH EMG (ELECTROMYOGRAPHY) REPORT   STUDY DATE: April 28th 2015 PATIENT NAME: Jacob Heath DOB: 12/24/1945 MRN: [id number]    TECHNOLOGIST: Laretta Alstrom ELECTROMYOGRAPHER: Marcial Pacas M.D.  CLINICAL INFORMATION:  68 years old right-handed Caucasian male, with history of bilateral knee replacement, presenting with right onset gait difficulty for more than 10 years, getting worse over the past 6 months, also develop mild dysarthria, mild dysphagia. There was no sensory changes. There was no significant weakness on examination, diffusely hyporeflexia. There was no tongue atrophy or weakness  FINDINGS: NERVE CONDUCTION STUDY: Bilateral peroneal sensory responses were normal. Bilateral peroneal to EDB, and tibial motor responses were normal. Bilateral tibial H. reflexes were absent.  Right median, ulnar sensory motor responses were normal.  NEEDLE ELECTROMYOGRAPHY:  Selected needle examination was performed at right lower extremity muscles, right upper extremity muscles, right lumbosacral paraspinals, right cervical paraspinals, right thoracic paraspinals, right sternocleidomastoid, right genioglossus.  Needle examination of right tibialis anterior, tibialis posterior, medial gastrocnemius, vastus lateralis, biceps femoris long head was normal.  There was no spontaneous activity at right lumbosacral paraspinal muscles, right L4, L5, S1  Needle examination of right extensor digitorum communis, biceps, triceps, deltoid, was normal.  There was no spontaneous activity at the right cervical paraspinal muscles, right C5, 6, 7  There was no spontaneous activity at right thoracic paraspinal muscles, right T9, T10, T11.  Needle examination of right sternocleidomastoid, and right genioglossus was normal,  IMPRESSION:   This is essentially a normal examinations. There is no evidence of large fiber neuropathy, there was no evidence of right cervical, right  lumbosacral radiculopathy. There was no evidence of myopathy.   INTERPRETING PHYSICIAN:   Marcial Pacas M.D. Ph.D. [facility] [address], [place], [place] [address] [phone]

## 2014-04-09 ENCOUNTER — Encounter: Payer: Self-pay | Admitting: Internal Medicine

## 2014-04-09 ENCOUNTER — Ambulatory Visit (INDEPENDENT_AMBULATORY_CARE_PROVIDER_SITE_OTHER): Payer: PRIVATE HEALTH INSURANCE | Admitting: Internal Medicine

## 2014-04-09 ENCOUNTER — Ambulatory Visit (HOSPITAL_COMMUNITY): Payer: PRIVATE HEALTH INSURANCE | Admitting: Physical Therapy

## 2014-04-09 VITALS — BP 150/92 | HR 70 | Temp 98.2°F | Wt 241.8 lb

## 2014-04-09 DIAGNOSIS — R270 Ataxia, unspecified: Secondary | ICD-10-CM

## 2014-04-09 DIAGNOSIS — M545 Low back pain, unspecified: Secondary | ICD-10-CM

## 2014-04-09 DIAGNOSIS — R29818 Other symptoms and signs involving the nervous system: Secondary | ICD-10-CM

## 2014-04-09 DIAGNOSIS — R2689 Other abnormalities of gait and mobility: Secondary | ICD-10-CM | POA: Insufficient documentation

## 2014-04-09 DIAGNOSIS — R279 Unspecified lack of coordination: Secondary | ICD-10-CM

## 2014-04-09 DIAGNOSIS — B079 Viral wart, unspecified: Secondary | ICD-10-CM

## 2014-04-09 DIAGNOSIS — R269 Unspecified abnormalities of gait and mobility: Secondary | ICD-10-CM

## 2014-04-09 DIAGNOSIS — M129 Arthropathy, unspecified: Secondary | ICD-10-CM

## 2014-04-09 DIAGNOSIS — Z23 Encounter for immunization: Secondary | ICD-10-CM

## 2014-04-09 DIAGNOSIS — I10 Essential (primary) hypertension: Secondary | ICD-10-CM

## 2014-04-09 MED ORDER — ZOSTER VACCINE LIVE 19400 UNT/0.65ML ~~LOC~~ SOLR: 0.6500 mL | Freq: Once | SUBCUTANEOUS | Status: DC

## 2014-04-09 NOTE — Patient Instructions (Signed)
Consider use of walker 
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
## 2014-04-10 ENCOUNTER — Telehealth: Payer: Self-pay | Admitting: Neurology

## 2014-04-10 ENCOUNTER — Ambulatory Visit (HOSPITAL_COMMUNITY): Payer: PRIVATE HEALTH INSURANCE | Admitting: Physical Therapy

## 2014-04-10 NOTE — Telephone Encounter (Signed)
Rodena Piety with Dr Hervey Ard office, calling to check status of referral to [facility] for further evaluation.  Please call and advise

## 2014-04-11 NOTE — Telephone Encounter (Signed)
Called Rodena Piety back and Left her a message Patients appt at [facility] will be July 20th at 8:45 am with Dr. Jackelyn Poling.

## 2014-04-11 NOTE — Telephone Encounter (Signed)
Called back and spoke to [place] and told her went sent referral to [facility] referral faxed on 03-21-2014. I will call [place] to follow up on appt and give her a call back.

## 2014-04-16 NOTE — Telephone Encounter (Signed)
Spoke with Patient and informed him of his appointment at [facility]. Patient wanted me to ask Dr. Nyoka Cowden if he thought this could be coming from inner ear problems and I spoke with Dr. Nyoka Cowden and he said No. Patient is aware.

## 2014-04-17 ENCOUNTER — Ambulatory Visit (HOSPITAL_COMMUNITY)
Admission: RE | Admit: 2014-04-17 | Discharge: 2014-04-17 | Disposition: A | Discharge status: Home / Self Care | Payer: PRIVATE HEALTH INSURANCE | Source: Ambulatory Visit | Attending: Neurology | Admitting: Neurology

## 2014-04-17 DIAGNOSIS — R262 Difficulty in walking, not elsewhere classified: Secondary | ICD-10-CM | POA: Insufficient documentation

## 2014-04-17 DIAGNOSIS — Z9181 History of falling: Secondary | ICD-10-CM | POA: Insufficient documentation

## 2014-04-17 DIAGNOSIS — IMO0001 Reserved for inherently not codable concepts without codable children: Secondary | ICD-10-CM | POA: Insufficient documentation
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
## 2014-04-19 ENCOUNTER — Ambulatory Visit (HOSPITAL_COMMUNITY)
Admission: RE | Admit: 2014-04-19 | Discharge: 2014-04-19 | Disposition: A | Discharge status: Home / Self Care | Payer: PRIVATE HEALTH INSURANCE | Source: Ambulatory Visit | Attending: Neurology | Admitting: Neurology
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
## 2014-04-24 ENCOUNTER — Ambulatory Visit (HOSPITAL_COMMUNITY): Payer: PRIVATE HEALTH INSURANCE | Admitting: Physical Therapy

## 2014-04-24 ENCOUNTER — Ambulatory Visit (HOSPITAL_COMMUNITY)
Admission: RE | Admit: 2014-04-24 | Discharge: 2014-04-24 | Disposition: A | Discharge status: Home / Self Care | Payer: PRIVATE HEALTH INSURANCE | Source: Ambulatory Visit | Attending: Internal Medicine | Admitting: Internal Medicine

## 2014-04-24 DIAGNOSIS — Z9181 History of falling: Secondary | ICD-10-CM | POA: Insufficient documentation

## 2014-04-24 DIAGNOSIS — IMO0001 Reserved for inherently not codable concepts without codable children: Secondary | ICD-10-CM | POA: Insufficient documentation

## 2014-04-24 DIAGNOSIS — R262 Difficulty in walking, not elsewhere classified: Secondary | ICD-10-CM | POA: Insufficient documentation
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
## 2014-04-26 ENCOUNTER — Ambulatory Visit (HOSPITAL_COMMUNITY)
Admission: RE | Admit: 2014-04-26 | Discharge: 2014-04-26 | Disposition: A | Discharge status: Home / Self Care | Payer: PRIVATE HEALTH INSURANCE | Source: Ambulatory Visit | Attending: Neurology | Admitting: Neurology
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
## 2014-05-01 ENCOUNTER — Ambulatory Visit (HOSPITAL_COMMUNITY)
Admission: RE | Admit: 2014-05-01 | Discharge: 2014-05-01 | Disposition: A | Discharge status: Home / Self Care | Payer: PRIVATE HEALTH INSURANCE | Source: Ambulatory Visit | Attending: Internal Medicine | Admitting: Internal Medicine
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
## 2014-05-03 ENCOUNTER — Ambulatory Visit (HOSPITAL_COMMUNITY)
Admission: RE | Admit: 2014-05-03 | Discharge: 2014-05-03 | Disposition: A | Discharge status: Home / Self Care | Payer: PRIVATE HEALTH INSURANCE | Source: Ambulatory Visit | Attending: Internal Medicine | Admitting: Internal Medicine
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
## 2014-05-08 ENCOUNTER — Ambulatory Visit (HOSPITAL_COMMUNITY)
Admission: RE | Admit: 2014-05-08 | Discharge: 2014-05-08 | Disposition: A | Discharge status: Home / Self Care | Payer: PRIVATE HEALTH INSURANCE | Source: Ambulatory Visit | Attending: Internal Medicine | Admitting: Internal Medicine
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
## 2014-05-10 ENCOUNTER — Ambulatory Visit (HOSPITAL_COMMUNITY)
Admission: RE | Admit: 2014-05-10 | Discharge: 2014-05-10 | Disposition: A | Discharge status: Home / Self Care | Payer: PRIVATE HEALTH INSURANCE | Source: Ambulatory Visit | Attending: Internal Medicine | Admitting: Internal Medicine
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
## 2014-05-15 ENCOUNTER — Ambulatory Visit (HOSPITAL_COMMUNITY)
Admission: RE | Admit: 2014-05-15 | Discharge: 2014-05-15 | Disposition: A | Discharge status: Home / Self Care | Payer: PRIVATE HEALTH INSURANCE | Source: Ambulatory Visit | Attending: Internal Medicine | Admitting: Internal Medicine
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
## 2014-05-17 ENCOUNTER — Ambulatory Visit (HOSPITAL_COMMUNITY)
Admission: RE | Admit: 2014-05-17 | Discharge: 2014-05-17 | Disposition: A | Discharge status: Home / Self Care | Payer: PRIVATE HEALTH INSURANCE | Source: Ambulatory Visit | Attending: Internal Medicine | Admitting: Internal Medicine
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
## 2014-05-22 ENCOUNTER — Ambulatory Visit (HOSPITAL_COMMUNITY)
Admission: RE | Admit: 2014-05-22 | Discharge: 2014-05-22 | Disposition: A | Discharge status: Home / Self Care | Payer: PRIVATE HEALTH INSURANCE | Source: Ambulatory Visit | Attending: Internal Medicine | Admitting: Internal Medicine

## 2014-05-22 DIAGNOSIS — Z9181 History of falling: Secondary | ICD-10-CM | POA: Insufficient documentation

## 2014-05-22 DIAGNOSIS — IMO0001 Reserved for inherently not codable concepts without codable children: Secondary | ICD-10-CM | POA: Insufficient documentation

## 2014-05-22 DIAGNOSIS — R262 Difficulty in walking, not elsewhere classified: Secondary | ICD-10-CM | POA: Insufficient documentation
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
## 2014-05-28 ENCOUNTER — Inpatient Hospital Stay (HOSPITAL_COMMUNITY): Admission: RE | Admit: 2014-05-28 | Payer: PRIVATE HEALTH INSURANCE | Source: Ambulatory Visit

## 2014-05-31 ENCOUNTER — Ambulatory Visit (HOSPITAL_COMMUNITY)
Admission: RE | Admit: 2014-05-31 | Discharge: 2014-05-31 | Disposition: A | Discharge status: Home / Self Care | Payer: PRIVATE HEALTH INSURANCE | Source: Ambulatory Visit | Attending: Internal Medicine | Admitting: Internal Medicine
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
## 2014-06-04 ENCOUNTER — Ambulatory Visit (HOSPITAL_COMMUNITY)
Admission: RE | Admit: 2014-06-04 | Discharge: 2014-06-04 | Disposition: A | Discharge status: Home / Self Care | Payer: PRIVATE HEALTH INSURANCE | Source: Ambulatory Visit | Attending: Internal Medicine | Admitting: Internal Medicine
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
## 2014-06-07 ENCOUNTER — Inpatient Hospital Stay (HOSPITAL_COMMUNITY)
Admission: RE | Admit: 2014-06-07 | Payer: PRIVATE HEALTH INSURANCE | Source: Ambulatory Visit | Admitting: Physical Therapy

## 2014-06-11 ENCOUNTER — Ambulatory Visit (HOSPITAL_COMMUNITY)
Admission: RE | Admit: 2014-06-11 | Discharge: 2014-06-11 | Disposition: A | Discharge status: Home / Self Care | Payer: PRIVATE HEALTH INSURANCE | Source: Ambulatory Visit | Attending: Internal Medicine | Admitting: Internal Medicine
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
## 2014-06-14 ENCOUNTER — Ambulatory Visit (HOSPITAL_COMMUNITY): Payer: PRIVATE HEALTH INSURANCE | Admitting: Physical Therapy

## 2014-06-18 ENCOUNTER — Ambulatory Visit (HOSPITAL_COMMUNITY)
Admission: RE | Admit: 2014-06-18 | Discharge: 2014-06-18 | Disposition: A | Discharge status: Home / Self Care | Payer: PRIVATE HEALTH INSURANCE | Source: Ambulatory Visit | Attending: Internal Medicine | Admitting: Internal Medicine
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
## 2014-06-19 ENCOUNTER — Ambulatory Visit (HOSPITAL_COMMUNITY): Payer: PRIVATE HEALTH INSURANCE | Admitting: Physical Therapy

## 2014-06-21 ENCOUNTER — Ambulatory Visit (HOSPITAL_COMMUNITY): Payer: PRIVATE HEALTH INSURANCE

## 2014-07-10 ENCOUNTER — Encounter: Payer: Self-pay | Admitting: Internal Medicine

## 2014-07-10 ENCOUNTER — Ambulatory Visit (INDEPENDENT_AMBULATORY_CARE_PROVIDER_SITE_OTHER): Payer: PRIVATE HEALTH INSURANCE | Admitting: Internal Medicine

## 2014-07-10 VITALS — BP 138/86 | HR 75 | Temp 98.2°F | Ht 65.75 in | Wt 242.6 lb

## 2014-07-10 DIAGNOSIS — R269 Unspecified abnormalities of gait and mobility: Secondary | ICD-10-CM

## 2014-07-10 DIAGNOSIS — K5900 Constipation, unspecified: Secondary | ICD-10-CM

## 2014-07-10 DIAGNOSIS — M545 Low back pain, unspecified: Secondary | ICD-10-CM

## 2014-07-10 DIAGNOSIS — Z9181 History of falling: Secondary | ICD-10-CM

## 2014-07-10 DIAGNOSIS — I10 Essential (primary) hypertension: Secondary | ICD-10-CM

## 2014-07-10 DIAGNOSIS — R279 Unspecified lack of coordination: Secondary | ICD-10-CM

## 2014-07-10 DIAGNOSIS — R2689 Other abnormalities of gait and mobility: Secondary | ICD-10-CM

## 2014-07-10 DIAGNOSIS — R29818 Other symptoms and signs involving the nervous system: Secondary | ICD-10-CM

## 2014-07-10 DIAGNOSIS — R270 Ataxia, unspecified: Secondary | ICD-10-CM
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
## 2014-09-18 ENCOUNTER — Encounter: Payer: Self-pay | Admitting: Neurology

## 2014-09-18 ENCOUNTER — Ambulatory Visit (INDEPENDENT_AMBULATORY_CARE_PROVIDER_SITE_OTHER): Payer: PRIVATE HEALTH INSURANCE | Admitting: Neurology

## 2014-09-18 VITALS — BP 124/74 | HR 61 | Ht 65.0 in | Wt 238.0 lb

## 2014-09-18 DIAGNOSIS — R270 Ataxia, unspecified: Secondary | ICD-10-CM

## 2014-09-18 DIAGNOSIS — R269 Unspecified abnormalities of gait and mobility: Secondary | ICD-10-CM

## 2014-09-18 MED ORDER — BACLOFEN 10 MG PO TABS: 10.0000 mg | ORAL_TABLET | Freq: Three times a day (TID) | ORAL | Status: DC
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
## 2015-01-08 ENCOUNTER — Ambulatory Visit: Payer: PRIVATE HEALTH INSURANCE | Admitting: Internal Medicine

## 2015-01-15 ENCOUNTER — Ambulatory Visit: Payer: PRIVATE HEALTH INSURANCE | Admitting: Internal Medicine

## 2015-01-21 ENCOUNTER — Ambulatory Visit (INDEPENDENT_AMBULATORY_CARE_PROVIDER_SITE_OTHER): Payer: PRIVATE HEALTH INSURANCE | Admitting: Internal Medicine

## 2015-01-21 ENCOUNTER — Encounter: Payer: Self-pay | Admitting: Internal Medicine

## 2015-01-21 VITALS — BP 122/84 | HR 75 | Temp 98.6°F | Resp 18 | Ht 65.0 in | Wt 235.2 lb

## 2015-01-21 DIAGNOSIS — I10 Essential (primary) hypertension: Secondary | ICD-10-CM

## 2015-01-21 DIAGNOSIS — R29818 Other symptoms and signs involving the nervous system: Secondary | ICD-10-CM | POA: Diagnosis not present

## 2015-01-21 DIAGNOSIS — R270 Ataxia, unspecified: Secondary | ICD-10-CM

## 2015-01-21 DIAGNOSIS — Z23 Encounter for immunization: Secondary | ICD-10-CM

## 2015-01-21 DIAGNOSIS — R1310 Dysphagia, unspecified: Secondary | ICD-10-CM | POA: Diagnosis not present

## 2015-01-21 DIAGNOSIS — M25561 Pain in right knee: Secondary | ICD-10-CM | POA: Diagnosis not present

## 2015-01-21 DIAGNOSIS — M25562 Pain in left knee: Secondary | ICD-10-CM

## 2015-01-21 DIAGNOSIS — R269 Unspecified abnormalities of gait and mobility: Secondary | ICD-10-CM

## 2015-01-21 DIAGNOSIS — R2689 Other abnormalities of gait and mobility: Secondary | ICD-10-CM

## 2015-01-21 DIAGNOSIS — R413 Other amnesia: Secondary | ICD-10-CM | POA: Diagnosis not present

## 2015-01-21 NOTE — Patient Instructions (Addendum)
Try the Atkin's Diet or the [employer].  Return for the Prevnar pneumococcal  Vaccine.
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
## 2015-01-22 ENCOUNTER — Ambulatory Visit: Payer: PRIVATE HEALTH INSURANCE | Admitting: Internal Medicine

## 2015-01-22 DIAGNOSIS — M25569 Pain in unspecified knee: Secondary | ICD-10-CM | POA: Insufficient documentation

## 2015-03-20 ENCOUNTER — Ambulatory Visit: Payer: PRIVATE HEALTH INSURANCE | Admitting: Neurology

## 2015-03-21 ENCOUNTER — Ambulatory Visit (INDEPENDENT_AMBULATORY_CARE_PROVIDER_SITE_OTHER): Payer: PRIVATE HEALTH INSURANCE | Admitting: Neurology

## 2015-03-21 ENCOUNTER — Encounter: Payer: Self-pay | Admitting: Neurology

## 2015-03-21 VITALS — BP 129/80 | HR 73 | Ht 65.0 in | Wt 238.0 lb

## 2015-03-21 DIAGNOSIS — R270 Ataxia, unspecified: Secondary | ICD-10-CM

## 2015-03-21 DIAGNOSIS — R269 Unspecified abnormalities of gait and mobility: Secondary | ICD-10-CM
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
## 2015-07-29 ENCOUNTER — Encounter: Payer: Self-pay | Admitting: Internal Medicine

## 2015-07-29 ENCOUNTER — Ambulatory Visit (INDEPENDENT_AMBULATORY_CARE_PROVIDER_SITE_OTHER): Payer: PRIVATE HEALTH INSURANCE | Admitting: Internal Medicine

## 2015-07-29 VITALS — BP 130/82 | HR 81 | Temp 97.9°F | Resp 16 | Ht 65.0 in | Wt 243.0 lb

## 2015-07-29 DIAGNOSIS — Z23 Encounter for immunization: Secondary | ICD-10-CM

## 2015-07-29 DIAGNOSIS — R269 Unspecified abnormalities of gait and mobility: Secondary | ICD-10-CM | POA: Diagnosis not present

## 2015-07-29 DIAGNOSIS — E785 Hyperlipidemia, unspecified: Secondary | ICD-10-CM | POA: Insufficient documentation

## 2015-07-29 DIAGNOSIS — L84 Corns and callosities: Secondary | ICD-10-CM | POA: Diagnosis not present

## 2015-07-29 DIAGNOSIS — F329 Major depressive disorder, single episode, unspecified: Secondary | ICD-10-CM | POA: Diagnosis not present

## 2015-07-29 DIAGNOSIS — M25562 Pain in left knee: Secondary | ICD-10-CM

## 2015-07-29 DIAGNOSIS — F411 Generalized anxiety disorder: Secondary | ICD-10-CM | POA: Diagnosis not present

## 2015-07-29 DIAGNOSIS — I10 Essential (primary) hypertension: Secondary | ICD-10-CM

## 2015-07-29 DIAGNOSIS — R29818 Other symptoms and signs involving the nervous system: Secondary | ICD-10-CM | POA: Diagnosis not present

## 2015-07-29 DIAGNOSIS — R2689 Other abnormalities of gait and mobility: Secondary | ICD-10-CM

## 2015-07-29 DIAGNOSIS — R413 Other amnesia: Secondary | ICD-10-CM

## 2015-07-29 DIAGNOSIS — R270 Ataxia, unspecified: Secondary | ICD-10-CM | POA: Diagnosis not present

## 2015-07-29 DIAGNOSIS — R202 Paresthesia of skin: Secondary | ICD-10-CM

## 2015-07-29 DIAGNOSIS — G4733 Obstructive sleep apnea (adult) (pediatric): Secondary | ICD-10-CM

## 2015-07-29 DIAGNOSIS — M25561 Pain in right knee: Secondary | ICD-10-CM | POA: Diagnosis not present

## 2015-07-29 DIAGNOSIS — F32A Depression, unspecified: Secondary | ICD-10-CM
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
## 2015-08-27 ENCOUNTER — Encounter: Payer: PRIVATE HEALTH INSURANCE | Admitting: Neurology

## 2015-09-25 ENCOUNTER — Encounter: Payer: PRIVATE HEALTH INSURANCE | Admitting: Neurology

## 2015-10-21 ENCOUNTER — Ambulatory Visit (INDEPENDENT_AMBULATORY_CARE_PROVIDER_SITE_OTHER): Payer: PRIVATE HEALTH INSURANCE | Admitting: Nurse Practitioner

## 2015-10-21 ENCOUNTER — Ambulatory Visit (HOSPITAL_COMMUNITY)
Admission: RE | Admit: 2015-10-21 | Discharge: 2015-10-21 | Disposition: A | Discharge status: Home / Self Care | Payer: PRIVATE HEALTH INSURANCE | Source: Ambulatory Visit | Attending: Nurse Practitioner | Admitting: Nurse Practitioner

## 2015-10-21 ENCOUNTER — Encounter: Payer: Self-pay | Admitting: Nurse Practitioner

## 2015-10-21 VITALS — BP 142/84 | HR 79 | Temp 98.0°F | Resp 14 | Ht 65.0 in | Wt 243.0 lb

## 2015-10-21 DIAGNOSIS — M7989 Other specified soft tissue disorders: Secondary | ICD-10-CM | POA: Insufficient documentation

## 2015-10-21 DIAGNOSIS — M79662 Pain in left lower leg: Secondary | ICD-10-CM

## 2015-10-21 DIAGNOSIS — M79605 Pain in left leg: Secondary | ICD-10-CM | POA: Diagnosis present

## 2015-10-21 NOTE — Progress Notes (Signed)
Patient ID: Jacob Heath, male   DOB: 07/16/1946, 69 y.o.   MRN: [id number]    PCP: Jacob Dooms, MD  Advanced Directive information Does patient have an advance directive?: No, Would patient like information on creating an advanced directive?: Yes - Educational materials given  Allergies  Allergen Reactions  . Codeine     itching    Chief Complaint  Patient presents with  . Acute Visit    Swollen left calf x 2-3 days, patient states "Calf is as hard as a brick". + Fall risk   . Other    Discuss Advance Directive paperwork     HPI: Patient is a 69 y.o. male seen in the office today due to swollen left calf. Has hx of left knee pain and OA. Pt has had workup due to gait instability and balance issues. In the last 4 days has had increased pain and swelling to left lower leg. No injury. Pain on both sides of the calf. Increased activity up until thanksgiving (4 days ago) getting ready for guest to arrive. Able to walk on leg without problem. knee pain is better at this time.  No hx of blood clots.  No increase use in NSAIDs Right leg without swelling or tenderness  No shortness of breath, chest pains, cough.  Review of Systems:  Review of Systems  Constitutional: Negative for activity change, appetite change, fatigue and unexpected weight change.  HENT: Negative for congestion.   Eyes: Negative.   Respiratory: Negative for cough and shortness of breath.   Cardiovascular: Positive for leg swelling (left leg). Negative for chest pain and palpitations.  Gastrointestinal: Negative for abdominal pain, diarrhea and constipation.  Genitourinary: Negative for dysuria and difficulty urinating.  Musculoskeletal: Positive for arthralgias (left knee pain, chronic ).  Skin: Negative for color change and wound.  Neurological: Negative for dizziness and weakness.  Psychiatric/Behavioral: Negative for behavioral problems, confusion and agitation.    Past Medical History  Diagnosis Date    . Arthritis   . OSA (obstructive sleep apnea)   . Hypertrophy of prostate without urinary obstruction and other lower urinary tract symptoms (LUTS)   . Osteoarthrosis, unspecified whether generalized or localized, lower leg   . Unspecified arthropathy, lower leg   . Internal hemorrhoids   . Hiatal hernia   . Constipation   . Hypertrophy of prostate without urinary obstruction and other lower urinary tract symptoms (LUTS)   . Abnormality of gait 01/01/2014  . Anxiety state, unspecified 01/01/2014  . Callus of heel 01/01/2014  . Constipation 01/01/2014  . 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 Cutaneous skin tags 01/01/2014  . Hypertension 01/03/2014  . Lipoma 01/01/2014  . Lumbago 01/01/2014  . Memory loss 01/01/2014  . OBSTRUCTIVE SLEEP APNEA 11/05/2009    npsg 1998:  AHI 23/hr, PLMS 124 with 8/hr with a/a On cpap   . Wart 01/03/2014    Right 4th finger    Past Surgical History  Procedure Laterality Date  . Knee surgery  08/1994 & 12/1996    x 2 Dr Noemi Chapel  . Rotator cuff repair      right shoulder  . 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 Testicle removal Right 2006  . Nasal septum surgery  2001  . Prepatellar bursectomy Right 1961  . Colonoscopy  07/14/2006   Social History:   reports that he quit smoking about 50 years ago. His smoking use included Cigarettes. He has a 12 pack-year smoking history. He has  never used smokeless tobacco. He reports that he drinks about 0.6 oz of alcohol per week. He reports that he does not use illicit drugs.  Family History  Problem Relation Age of Onset  . Allergies Sister   . Asthma Sister   . Heart disease Mother   . Breast cancer Mother   . Heart disease Brother   . Heart disease Paternal Grandmother   . Breast cancer Paternal Grandmother   . Stroke Maternal Grandmother   . Breast cancer Sister   .       Medications: Patient's Medications  New Prescriptions   No medications on file  Previous Medications   CLOTRIMAZOLE  (ANTI-FUNGAL) 1 % CREAM    Apply 1 application topically as needed.   NAPROXEN SODIUM (ANAPROX) 220 MG TABLET    Take 220 mg by mouth as needed.  Modified Medications   No medications on file  Discontinued Medications   No medications on file     Physical Exam:  Filed Vitals:   10/21/15 1101  BP: 142/84  Pulse: 79  Temp: 98 F (36.7 C)  TempSrc: Oral  Resp: 14  Height: 5\' 5"  (1.651 m)  Weight: 243 lb (110.224 kg)  SpO2: 95%   Body mass index is 40.44 kg/(m^2).  Physical Exam  Constitutional: He is oriented to person, place, and time. He appears well-developed and well-nourished. No distress.  Cardiovascular: Normal rate, regular rhythm, normal heart sounds and intact distal pulses.   Pulmonary/Chest: Effort normal and breath sounds normal.  Musculoskeletal: He exhibits edema (2+ to left lower leg from knee down) and tenderness (noted to lateral and medial aspect of calf, no errythema noted).  Neurological: He is alert and oriented to person, place, and time.  Skin: Skin is warm and dry. He is not diaphoretic. No erythema.  Psychiatric: He has a normal mood and affect.    Labs reviewed: Basic Metabolic Panel: No results for input(s): NA, K, CL, CO2, GLUCOSE, BUN, CREATININE, CALCIUM, MG, PHOS, TSH in the last 8760 hours. Liver Function Tests: No results for input(s): AST, ALT, ALKPHOS, BILITOT, PROT, ALBUMIN in the last 8760 hours. No results for input(s): LIPASE, AMYLASE in the last 8760 hours. No results for input(s): AMMONIA in the last 8760 hours. CBC: No results for input(s): WBC, NEUTROABS, HGB, HCT, MCV, PLT in the last 8760 hours. Lipid Panel: No results for input(s): CHOL, HDL, LDLCALC, TRIG, CHOLHDL, LDLDIRECT in the last 8760 hours. TSH: No results for input(s): TSH in the last 8760 hours. A1C: No results found for: HGBA1C   Assessment/Plan 1. Pain and swelling of left lower leg Will get US Venous Img Lower Unilateral Left rule out DVT, will be done  today at [facility] at 2 pm.    Janett Billow K. Harle Battiest  [facility] [phone] 8 am - 5 pm) [phone] (after hours)

## 2015-10-21 NOTE — Patient Instructions (Signed)
Will send you over to [facility] to get Ultrasound of left leg to rule out blood clot

## 2015-10-28 ENCOUNTER — Encounter: Payer: Self-pay | Admitting: Internal Medicine

## 2015-10-28 ENCOUNTER — Ambulatory Visit (INDEPENDENT_AMBULATORY_CARE_PROVIDER_SITE_OTHER): Payer: PRIVATE HEALTH INSURANCE | Admitting: Internal Medicine

## 2015-10-28 VITALS — BP 132/80 | HR 75 | Temp 97.9°F | Ht 65.0 in | Wt 240.0 lb

## 2015-10-28 DIAGNOSIS — M660 Rupture of popliteal cyst: Secondary | ICD-10-CM | POA: Insufficient documentation

## 2015-10-28 DIAGNOSIS — M25561 Pain in right knee: Secondary | ICD-10-CM | POA: Diagnosis not present

## 2015-10-28 DIAGNOSIS — M25562 Pain in left knee: Secondary | ICD-10-CM | POA: Diagnosis not present

## 2015-10-28 DIAGNOSIS — I10 Essential (primary) hypertension: Secondary | ICD-10-CM
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
## 2015-11-06 ENCOUNTER — Other Ambulatory Visit: Payer: Self-pay | Admitting: Orthopedic Surgery

## 2015-11-06 DIAGNOSIS — R52 Pain, unspecified: Secondary | ICD-10-CM

## 2015-11-07 ENCOUNTER — Ambulatory Visit
Admission: RE | Admit: 2015-11-07 | Discharge: 2015-11-07 | Disposition: A | Discharge status: Home / Self Care | Payer: PRIVATE HEALTH INSURANCE | Source: Ambulatory Visit | Attending: Orthopedic Surgery | Admitting: Orthopedic Surgery

## 2015-11-07 DIAGNOSIS — R52 Pain, unspecified: Secondary | ICD-10-CM

## 2015-11-11 ENCOUNTER — Ambulatory Visit: Payer: Self-pay | Admitting: Internal Medicine

## 2015-11-14 ENCOUNTER — Other Ambulatory Visit: Payer: PRIVATE HEALTH INSURANCE

## 2016-01-26 ENCOUNTER — Other Ambulatory Visit: Payer: No Typology Code available for payment source

## 2016-01-26 DIAGNOSIS — E785 Hyperlipidemia, unspecified: Secondary | ICD-10-CM

## 2016-01-26 DIAGNOSIS — I10 Essential (primary) hypertension: Secondary | ICD-10-CM

## 2016-01-27 LAB — COMPREHENSIVE METABOLIC PANEL
ALT: 14 IU/L (ref 0–44)
AST: 13 IU/L (ref 0–40)
Albumin/Globulin Ratio: 1.3 (ref 1.1–2.5)
Albumin: 4 g/dL (ref 3.5–4.8)
Alkaline Phosphatase: 87 IU/L (ref 39–117)
BUN/Creatinine Ratio: 13 (ref 10–22)
BUN: 13 mg/dL (ref 8–27)
Bilirubin Total: 0.7 mg/dL (ref 0.0–1.2)
CO2: 26 mmol/L (ref 18–29)
Calcium: 9.1 mg/dL (ref 8.6–10.2)
Chloride: 102 mmol/L (ref 96–106)
Creatinine, Ser: 1.03 mg/dL (ref 0.76–1.27)
GFR calc Af Amer: 85 mL/min/{1.73_m2} (ref >59)
GFR calc non Af Amer: 73 mL/min/{1.73_m2} (ref >59)
Globulin, Total: 3 g/dL (ref 1.5–4.5)
Glucose: 177 mg/dL — ABNORMAL HIGH (ref 65–99)
Potassium: 4.4 mmol/L (ref 3.5–5.2)
Sodium: 142 mmol/L (ref 134–144)
Total Protein: 7 g/dL (ref 6.0–8.5)

## 2016-01-27 LAB — LIPID PANEL
Chol/HDL Ratio: 5.2 ratio units — ABNORMAL HIGH (ref 0.0–5.0)
Cholesterol, Total: 212 mg/dL — ABNORMAL HIGH (ref 100–199)
HDL: 41 mg/dL (ref >39)
LDL Calculated: 134 mg/dL — ABNORMAL HIGH (ref 0–99)
Triglycerides: 184 mg/dL — ABNORMAL HIGH (ref 0–149)
VLDL Cholesterol Cal: 37 mg/dL (ref 5–40)

## 2016-01-28 ENCOUNTER — Encounter: Payer: Self-pay | Admitting: Internal Medicine

## 2016-01-28 ENCOUNTER — Ambulatory Visit (INDEPENDENT_AMBULATORY_CARE_PROVIDER_SITE_OTHER): Payer: No Typology Code available for payment source | Admitting: Internal Medicine

## 2016-01-28 VITALS — BP 118/78 | HR 80 | Temp 98.0°F | Resp 20 | Ht 65.0 in | Wt 232.0 lb

## 2016-01-28 DIAGNOSIS — I10 Essential (primary) hypertension: Secondary | ICD-10-CM

## 2016-01-28 DIAGNOSIS — R29818 Other symptoms and signs involving the nervous system: Secondary | ICD-10-CM | POA: Diagnosis not present

## 2016-01-28 DIAGNOSIS — M25561 Pain in right knee: Secondary | ICD-10-CM

## 2016-01-28 DIAGNOSIS — R2689 Other abnormalities of gait and mobility: Secondary | ICD-10-CM

## 2016-01-28 DIAGNOSIS — M25562 Pain in left knee: Secondary | ICD-10-CM | POA: Diagnosis not present

## 2016-01-28 DIAGNOSIS — M7632 Iliotibial band syndrome, left leg: Secondary | ICD-10-CM | POA: Diagnosis not present

## 2016-01-28 DIAGNOSIS — M7630 Iliotibial band syndrome, unspecified leg: Secondary | ICD-10-CM | POA: Insufficient documentation

## 2016-01-28 DIAGNOSIS — R21 Rash and other nonspecific skin eruption: Secondary | ICD-10-CM | POA: Diagnosis not present
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
## 2016-04-27 DIAGNOSIS — R739 Hyperglycemia, unspecified: Secondary | ICD-10-CM | POA: Insufficient documentation

## 2016-05-05 ENCOUNTER — Encounter: Payer: Self-pay | Admitting: Neurology

## 2016-05-05 ENCOUNTER — Ambulatory Visit (INDEPENDENT_AMBULATORY_CARE_PROVIDER_SITE_OTHER): Payer: No Typology Code available for payment source | Admitting: Neurology

## 2016-05-05 VITALS — BP 129/81 | HR 66 | Ht 65.0 in | Wt 229.0 lb

## 2016-05-05 DIAGNOSIS — G8929 Other chronic pain: Secondary | ICD-10-CM | POA: Diagnosis not present

## 2016-05-05 DIAGNOSIS — R270 Ataxia, unspecified: Secondary | ICD-10-CM | POA: Diagnosis not present

## 2016-05-05 DIAGNOSIS — G319 Degenerative disease of nervous system, unspecified: Secondary | ICD-10-CM | POA: Insufficient documentation

## 2016-05-05 DIAGNOSIS — R269 Unspecified abnormalities of gait and mobility: Secondary | ICD-10-CM | POA: Diagnosis not present

## 2016-05-05 DIAGNOSIS — M545 Low back pain: Secondary | ICD-10-CM

## 2016-05-05 DIAGNOSIS — G968 Other specified disorders of central nervous system: Secondary | ICD-10-CM

## 2016-05-05 MED ORDER — LUMBAR BACK BRACE/SUPPORT PAD MISC: Status: DC
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
## 2016-07-28 ENCOUNTER — Encounter: Payer: Self-pay | Admitting: Internal Medicine

## 2016-07-28 ENCOUNTER — Other Ambulatory Visit: Payer: Self-pay | Admitting: Internal Medicine

## 2016-07-28 ENCOUNTER — Ambulatory Visit (INDEPENDENT_AMBULATORY_CARE_PROVIDER_SITE_OTHER): Payer: No Typology Code available for payment source | Admitting: Internal Medicine

## 2016-07-28 VITALS — BP 140/86 | HR 72 | Temp 97.9°F | Ht 65.0 in | Wt 239.0 lb

## 2016-07-28 DIAGNOSIS — R1310 Dysphagia, unspecified: Secondary | ICD-10-CM

## 2016-07-28 DIAGNOSIS — G319 Degenerative disease of nervous system, unspecified: Secondary | ICD-10-CM

## 2016-07-28 DIAGNOSIS — R739 Hyperglycemia, unspecified: Secondary | ICD-10-CM

## 2016-07-28 DIAGNOSIS — G968 Other specified disorders of central nervous system: Secondary | ICD-10-CM

## 2016-07-28 DIAGNOSIS — Z125 Encounter for screening for malignant neoplasm of prostate: Secondary | ICD-10-CM | POA: Diagnosis not present

## 2016-07-28 DIAGNOSIS — L84 Corns and callosities: Secondary | ICD-10-CM | POA: Diagnosis not present

## 2016-07-28 DIAGNOSIS — R269 Unspecified abnormalities of gait and mobility: Secondary | ICD-10-CM | POA: Diagnosis not present

## 2016-07-28 DIAGNOSIS — E785 Hyperlipidemia, unspecified: Secondary | ICD-10-CM

## 2016-07-28 DIAGNOSIS — R202 Paresthesia of skin: Secondary | ICD-10-CM | POA: Diagnosis not present

## 2016-07-28 DIAGNOSIS — M545 Low back pain, unspecified: Secondary | ICD-10-CM

## 2016-07-28 DIAGNOSIS — I10 Essential (primary) hypertension: Secondary | ICD-10-CM

## 2016-07-28 DIAGNOSIS — R270 Ataxia, unspecified: Secondary | ICD-10-CM

## 2016-07-28 DIAGNOSIS — Z23 Encounter for immunization: Secondary | ICD-10-CM

## 2016-07-28 DIAGNOSIS — G8929 Other chronic pain: Secondary | ICD-10-CM

## 2016-07-28 MED ORDER — UREA 20 % EX CREA: TOPICAL_CREAM | CUTANEOUS | 5 refills | Status: DC
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
## 2016-07-29 LAB — COMPREHENSIVE METABOLIC PANEL
ALT: 17 IU/L (ref 0–44)
AST: 16 IU/L (ref 0–40)
Albumin/Globulin Ratio: 1.5 (ref 1.2–2.2)
Albumin: 4.5 g/dL (ref 3.5–4.8)
Alkaline Phosphatase: 98 IU/L (ref 39–117)
BUN/Creatinine Ratio: 19 (ref 10–24)
BUN: 18 mg/dL (ref 8–27)
Bilirubin Total: 0.6 mg/dL (ref 0.0–1.2)
CO2: 25 mmol/L (ref 18–29)
Calcium: 9.4 mg/dL (ref 8.6–10.2)
Chloride: 103 mmol/L (ref 96–106)
Creatinine, Ser: 0.96 mg/dL (ref 0.76–1.27)
GFR calc Af Amer: 92 mL/min/{1.73_m2} (ref >59)
GFR calc non Af Amer: 80 mL/min/{1.73_m2} (ref >59)
Globulin, Total: 3.1 g/dL (ref 1.5–4.5)
Glucose: 92 mg/dL (ref 65–99)
Potassium: 4.8 mmol/L (ref 3.5–5.2)
Sodium: 143 mmol/L (ref 134–144)
Total Protein: 7.6 g/dL (ref 6.0–8.5)

## 2016-07-29 LAB — LIPID PANEL W/O CHOL/HDL RATIO
Cholesterol, Total: 205 mg/dL — ABNORMAL HIGH (ref 100–199)
HDL: 47 mg/dL (ref >39)
LDL Calculated: 129 mg/dL — ABNORMAL HIGH (ref 0–99)
Triglycerides: 143 mg/dL (ref 0–149)
VLDL Cholesterol Cal: 29 mg/dL (ref 5–40)

## 2016-07-29 LAB — VITAMIN B12: Vitamin B-12: 419 pg/mL (ref 211–946)

## 2016-07-29 LAB — HGB A1C W/O EAG: Hgb A1c MFr Bld: 6.8 % — ABNORMAL HIGH (ref 4.8–5.6)

## 2016-07-29 LAB — PSA: Prostate Specific Ag, Serum: 2.2 ng/mL (ref 0.0–4.0)

## 2016-12-20 ENCOUNTER — Telehealth: Payer: Self-pay | Admitting: Internal Medicine

## 2016-12-20 NOTE — Telephone Encounter (Signed)
left msg asking pt to confirm this AWV appt w/ nurse. VDM (DD) °

## 2017-01-12 DIAGNOSIS — M6530 Trigger finger, unspecified finger: Secondary | ICD-10-CM | POA: Insufficient documentation

## 2017-01-26 ENCOUNTER — Ambulatory Visit (INDEPENDENT_AMBULATORY_CARE_PROVIDER_SITE_OTHER): Payer: No Typology Code available for payment source | Admitting: Internal Medicine

## 2017-01-26 ENCOUNTER — Encounter: Payer: Self-pay | Admitting: Internal Medicine

## 2017-01-26 ENCOUNTER — Ambulatory Visit: Payer: No Typology Code available for payment source

## 2017-01-26 VITALS — BP 142/92 | HR 79 | Temp 97.9°F | Ht 65.0 in | Wt 243.0 lb

## 2017-01-26 DIAGNOSIS — E669 Obesity, unspecified: Secondary | ICD-10-CM | POA: Insufficient documentation

## 2017-01-26 DIAGNOSIS — R2689 Other abnormalities of gait and mobility: Secondary | ICD-10-CM

## 2017-01-26 DIAGNOSIS — R05 Cough: Secondary | ICD-10-CM

## 2017-01-26 DIAGNOSIS — Z6841 Body Mass Index (BMI) 40.0 and over, adult: Secondary | ICD-10-CM

## 2017-01-26 DIAGNOSIS — R059 Cough, unspecified: Secondary | ICD-10-CM | POA: Insufficient documentation

## 2017-01-26 DIAGNOSIS — E785 Hyperlipidemia, unspecified: Secondary | ICD-10-CM

## 2017-01-26 DIAGNOSIS — I10 Essential (primary) hypertension: Secondary | ICD-10-CM | POA: Diagnosis not present

## 2017-01-26 DIAGNOSIS — R1310 Dysphagia, unspecified: Secondary | ICD-10-CM

## 2017-01-26 DIAGNOSIS — IMO0001 Reserved for inherently not codable concepts without codable children: Secondary | ICD-10-CM

## 2017-01-26 DIAGNOSIS — R269 Unspecified abnormalities of gait and mobility: Secondary | ICD-10-CM

## 2017-01-26 DIAGNOSIS — M65331 Trigger finger, right middle finger: Secondary | ICD-10-CM

## 2017-01-26 DIAGNOSIS — R270 Ataxia, unspecified: Secondary | ICD-10-CM

## 2017-01-26 DIAGNOSIS — E6609 Other obesity due to excess calories: Secondary | ICD-10-CM

## 2017-01-26 NOTE — Patient Instructions (Signed)
For cough try Delsym cough medication. Available OTC.
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
## 2017-05-05 ENCOUNTER — Ambulatory Visit (INDEPENDENT_AMBULATORY_CARE_PROVIDER_SITE_OTHER): Payer: No Typology Code available for payment source | Admitting: Neurology

## 2017-05-05 ENCOUNTER — Encounter: Payer: Self-pay | Admitting: Neurology

## 2017-05-05 VITALS — BP 133/81 | HR 73 | Resp 18 | Ht 65.0 in | Wt 231.0 lb

## 2017-05-05 DIAGNOSIS — G968 Other specified disorders of central nervous system: Secondary | ICD-10-CM

## 2017-05-05 DIAGNOSIS — G319 Degenerative disease of nervous system, unspecified: Secondary | ICD-10-CM
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
## 2017-05-05 NOTE — Patient Instructions (Signed)
[facility]  [place], [place] [address] Directions  [phone]  Hours of Operation Mondays to Thursdays: 8 am to 8 pm Fridays: 9 am to 8 pm Saturdays: 9 am to 1 pm

## 2017-05-09 ENCOUNTER — Telehealth: Payer: Self-pay | Admitting: Neurology

## 2017-05-09 NOTE — Telephone Encounter (Signed)
Spoke to patient - he was instructed to call [employer] at [phone], ext [phone] and speak with them about the [facility].  Based on his household income, he will be given an out-of-pocket maximum cost for the lab.  He verbalized understanding and also has all the information written down. He will call me back if he runs into any problems.

## 2017-05-09 NOTE — Telephone Encounter (Signed)
Pt calling re: the genetic testing, he said he was told that Dr Krista Blue would contact his insurance company  And that he would be contacted to find out what his cost would be.  Pt said she was contacted by someone wanting to come out to draw blood but has yet to be informed how much cost he is looking at being responsible for.  Please call

## 2017-06-09 ENCOUNTER — Encounter: Payer: Self-pay | Admitting: Family Medicine

## 2017-06-09 ENCOUNTER — Ambulatory Visit (INDEPENDENT_AMBULATORY_CARE_PROVIDER_SITE_OTHER): Payer: No Typology Code available for payment source | Admitting: Family Medicine

## 2017-06-09 ENCOUNTER — Other Ambulatory Visit: Payer: Self-pay | Admitting: Family Medicine

## 2017-06-09 VITALS — BP 128/80 | HR 72 | Temp 97.8°F | Ht 66.0 in | Wt 229.0 lb

## 2017-06-09 DIAGNOSIS — Z125 Encounter for screening for malignant neoplasm of prostate: Secondary | ICD-10-CM

## 2017-06-09 DIAGNOSIS — G118 Other hereditary ataxias: Secondary | ICD-10-CM | POA: Diagnosis not present

## 2017-06-09 DIAGNOSIS — I10 Essential (primary) hypertension: Secondary | ICD-10-CM

## 2017-06-09 DIAGNOSIS — G8929 Other chronic pain: Secondary | ICD-10-CM

## 2017-06-09 DIAGNOSIS — M25562 Pain in left knee: Secondary | ICD-10-CM

## 2017-06-09 DIAGNOSIS — Z0000 Encounter for general adult medical examination without abnormal findings: Secondary | ICD-10-CM

## 2017-06-09 LAB — CBC WITH DIFFERENTIAL/PLATELET
Basophils Absolute: 0 cells/uL (ref 0–200)
Basophils Relative: 0 %
Eosinophils Absolute: 296 cells/uL (ref 15–500)
Eosinophils Relative: 4 %
HCT: 44.5 % (ref 38.5–50.0)
Hemoglobin: 14.1 g/dL (ref 13.0–17.0)
Lymphocytes Relative: 35 %
Lymphs Abs: 2590 cells/uL (ref 850–3900)
MCH: 27 pg (ref 27.0–33.0)
MCHC: 31.7 g/dL — ABNORMAL LOW (ref 32.0–36.0)
MCV: 85.2 fL (ref 80.0–100.0)
MPV: 10.2 fL (ref 7.5–12.5)
Monocytes Absolute: 444 cells/uL (ref 200–950)
Monocytes Relative: 6 %
Neutro Abs: 4070 cells/uL (ref 1500–7800)
Neutrophils Relative %: 55 %
Platelets: 309 10*3/uL (ref 140–400)
RBC: 5.22 MIL/uL (ref 4.20–5.80)
RDW: 15.1 % — ABNORMAL HIGH (ref 11.0–15.0)
WBC: 7.4 10*3/uL (ref 3.8–10.8)

## 2017-06-09 MED ORDER — DICLOFENAC SODIUM 75 MG PO TBEC: 75.0000 mg | DELAYED_RELEASE_TABLET | Freq: Two times a day (BID) | ORAL | 0 refills | Status: DC

## 2017-06-09 NOTE — Progress Notes (Signed)
Subjective:    Patient ID: Jacob Heath, male    DOB: 09-Aug-1946, 71 y.o.   MRN: [id number]  HPI Patient is a 71 year old Caucasian male here today to establish care. 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 Patient currently sees Dr. Krista Blue.  Workup has included an MRI of the brain, MRI of the cervical, thoracic, and lumbar spine as well as numerous lab studies and nerve conduction test. 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 The only treatment option for this includes physical therapy which he is undergone numerous occasions. He has extremely poor balance. He falls numerous times every week. Today on his exam, he has bruises on his lateral right ribs, on his lateral left bicep, as well as on his left lower back. He walks with a cane. He has a difficult time standing from a seated position. In fact on my exam today, I have 2 hold onto her shoulders as he stands up some but he doesn't lose his balance and fall. He also has tremendous difficulty climbing onto the exam table. However his primary concern is to establish care and to discuss the pain in his knees. About 14 years ago, the patient had a left knee replacement. Over the last 10 years, he is developed a daily constant pain on the medial aspect of the left knee. He has crepitus in both knees left greater than right. He has tenderness to palpation over the medial joint line as well as above the femoral condyle/medial. There is no swelling in that area. There is no erythema. He does appear to have a small effusion. Patient states that a year ago, he had a Baker cyst rupture behind his left knee and the pain improved temporarily. He has a history of a right knee replacement about 13 years ago but does not cause him as much difficulty. The pain in his knee prevents him from walking or doing much physical exercise. As a result he has worsening muscle  degeneration and deterioration which only complicates his balance issues from the spinocerebellar ataxia.  He is due for prostate cancer screening. He is due for colonoscopy in 2023. Tetanus shot is up-to-date. Pneumonia vaccine is up-to-date 2 including Prevnar and Pneumovax 23. Past Medical History:  Diagnosis Date  . Abnormality of gait 01/01/2014  . Anxiety state, unspecified 01/01/2014  . Arthritis   . 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 Krista Blue  . Callus of heel 01/01/2014  . Constipation   . Constipation 01/01/2014  . Cutaneous skin tags 01/01/2014  . Hiatal hernia   . Hypertension 01/03/2014  . Hypertrophy of prostate without urinary obstruction and other lower urinary tract symptoms (LUTS)   . Hypertrophy of prostate without urinary obstruction and other lower urinary tract symptoms (LUTS)   . Internal hemorrhoids   . Lipoma 01/01/2014  . Lumbago 01/01/2014  . Memory loss 01/01/2014  . OBSTRUCTIVE SLEEP APNEA 11/05/2009   npsg 1998:  AHI 23/hr, PLMS 124 with 8/hr with a/a On cpap   . OSA (obstructive sleep apnea)   . Osteoarthrosis, unspecified whether generalized or localized, lower leg   . Unspecified arthropathy, lower leg   . Wart 01/03/2014   Right 4th finger    Past Surgical History:  Procedure Laterality Date  . COLONOSCOPY  07/14/2006  . JOINT REPLACEMENT     bilateral knee replacement  . KNEE SURGERY  08/1994 & 12/1996   x 2 Dr Noemi Chapel  .  NASAL SEPTUM SURGERY  2001  . Prepatellar bursectomy Right 1961  . ROTATOR CUFF REPAIR     right shoulder  . TESTICLE REMOVAL Right 2006  . TOTAL KNEE ARTHROPLASTY Bilateral LT 03/2002 & RT 02/2003   Current Outpatient Prescriptions on File Prior to Visit  Medication Sig Dispense Refill  . clotrimazole (ANTI-FUNGAL) 1 % cream Apply 1 application topically as needed.     No current facility-administered medications on file prior to visit.    Allergies  Allergen Reactions  . Codeine     itching   Social History   Social History  .  Marital status: Married    Spouse name: N/A  . Number of children: 2  . Years of education: 12   Occupational History  . Retired     Worked from post office   Social History Main Topics  . Smoking status: Former Smoker    Packs/day: 2.00    Years: 6.00    Types: Cigarettes    Quit date: 11/22/1964  . Smokeless tobacco: Never Used  . Alcohol use 0.6 oz/week    1 Cans of beer per week     Comment: rare  . Drug use: No  . Sexual activity: Not on file   Other Topics Concern  . Not on file   Social History Narrative   Patient lives at home with his wife Threasa Beards)  and  His mother    Retired   High school education   Right handed   Family History  Problem Relation Age of Onset  . Allergies Sister   . Heart disease Mother   . Breast cancer Mother   . Heart disease Brother   . Heart disease Paternal Grandmother   . Breast cancer Paternal Grandmother   . Asthma Sister   . Stroke Maternal Grandmother   . Breast cancer Sister       Review of Systems  All other systems reviewed and are negative.      Objective:   Physical Exam  Constitutional: He is oriented to person, place, and time. He appears well-developed and well-nourished. No distress.  HENT:  Head: Normocephalic and atraumatic.  Right Ear: External ear normal.  Left Ear: External ear normal.  Nose: Nose normal.  Mouth/Throat: Oropharynx is clear and moist. No oropharyngeal exudate.  Eyes: Pupils are equal, round, and reactive to light. Conjunctivae and EOM are normal. Right eye exhibits no discharge. Left eye exhibits no discharge. No scleral icterus.  Neck: Normal range of motion. Neck supple. No JVD present. No tracheal deviation present. No thyromegaly present.  Cardiovascular: Normal rate, regular rhythm, normal heart sounds and intact distal pulses.  Exam reveals no gallop and no friction rub.   No murmur heard. Pulmonary/Chest: Effort normal and breath sounds normal. No stridor. No respiratory  distress. He has no wheezes. He has no rales. He exhibits no tenderness.  Abdominal: Soft. Bowel sounds are normal. He exhibits no distension and no mass. There is no tenderness. There is no rebound and no guarding.  Musculoskeletal:       Right knee: He exhibits decreased range of motion.       Left knee: He exhibits decreased range of motion, effusion and bony tenderness. Tenderness found. Medial joint line tenderness noted.  Lymphadenopathy:    He has no cervical adenopathy.  Neurological: He is alert and oriented to person, place, and time. He has normal reflexes. He displays no atrophy, no tremor and normal reflexes. No cranial nerve deficit.  He exhibits normal muscle tone. He displays no seizure activity. Coordination and gait abnormal.  Skin: Skin is warm and dry. No rash noted. He is not diaphoretic. No erythema. No pallor.  Vitals reviewed.         Assessment & Plan:  General medical exam - Plan: CBC with Differential/Platelet, COMPLETE METABOLIC PANEL WITH GFR, Lipid panel, PSA  Benign essential HTN - Plan: CBC with Differential/Platelet, COMPLETE METABOLIC PANEL WITH GFR, Lipid panel  Prostate cancer screening - Plan: PSA  Spinocerebellar ataxia ([facility])  Chronic pain of left knee  I would like the patient to try diclofenac 75 mg by mouth twice a day to treat his left knee pain. The pain is keeping him from becoming physically active which is leading to muscle degeneration/deconditioning which is only exacerbating his balance issues and falls due to spinocerebellar ataxia. Immunizations are up-to-date. Cancer screening is up-to-date. I will check a CBC, CMP, fasting lipid panel, and a PSA. I'll recheck the patient in 2 weeks to see if his knee pain is improving. His orthopedic surgeon has only obtain x-rays more than a year ago which showed no evidence of any problems with the knee replacement according to the patient. He states that the orthopedic surgeon recommended physical  therapy which he states did not help her knee pain. I suspect the knee pain may be due to scar tissue after the surgery versus nerve damage if there is no abnormality with the hardware on exam.  Patient follows up regularly with his neurologist due to his ataxia. I encouraged the patient to use a cane all the time. He also would benefit from a walker for prolonged standing or walking. Immunizations are up-to-date. Cancer screening is up-to-date aside from a PSA.

## 2017-06-10 LAB — COMPLETE METABOLIC PANEL WITH GFR
ALT: 16 U/L (ref 9–46)
AST: 16 U/L (ref 10–35)
Albumin: 4.2 g/dL (ref 3.6–5.1)
Alkaline Phosphatase: 93 U/L (ref 40–115)
BUN: 13 mg/dL (ref 7–25)
CO2: 17 mmol/L — ABNORMAL LOW (ref 20–31)
Calcium: 9.2 mg/dL (ref 8.6–10.3)
Chloride: 105 mmol/L (ref 98–110)
Creat: 1.14 mg/dL (ref 0.70–1.18)
GFR, Est African American: 74 mL/min (ref >=60)
GFR, Est Non African American: 64 mL/min (ref >=60)
Glucose, Bld: 129 mg/dL — ABNORMAL HIGH (ref 70–99)
Potassium: 4.6 mmol/L (ref 3.5–5.3)
Sodium: 140 mmol/L (ref 135–146)
Total Bilirubin: 0.8 mg/dL (ref 0.2–1.2)
Total Protein: 7.7 g/dL (ref 6.1–8.1)

## 2017-06-10 LAB — LIPID PANEL
Cholesterol: 195 mg/dL (ref <200)
HDL: 45 mg/dL (ref >40)
LDL Cholesterol: 122 mg/dL — ABNORMAL HIGH (ref <100)
Total CHOL/HDL Ratio: 4.3 Ratio (ref <5.0)
Triglycerides: 139 mg/dL (ref <150)
VLDL: 28 mg/dL (ref <30)

## 2017-06-10 LAB — PSA: PSA: 1.6 ng/mL (ref <=4.0)

## 2017-06-15 LAB — HEMOGLOBIN A1C
Hgb A1c MFr Bld: 6.2 % — ABNORMAL HIGH (ref <5.7)
Mean Plasma Glucose: 131 mg/dL

## 2017-06-16 ENCOUNTER — Encounter: Payer: Self-pay | Admitting: Family Medicine

## 2017-06-16 DIAGNOSIS — R7303 Prediabetes: Secondary | ICD-10-CM | POA: Insufficient documentation

## 2017-07-08 ENCOUNTER — Other Ambulatory Visit: Payer: Self-pay | Admitting: Family Medicine

## 2017-08-30 ENCOUNTER — Ambulatory Visit (INDEPENDENT_AMBULATORY_CARE_PROVIDER_SITE_OTHER): Payer: No Typology Code available for payment source | Admitting: *Deleted

## 2017-08-30 DIAGNOSIS — Z23 Encounter for immunization: Secondary | ICD-10-CM | POA: Diagnosis not present

## 2017-10-13 ENCOUNTER — Other Ambulatory Visit: Payer: Self-pay | Admitting: Family Medicine

## 2018-05-08 ENCOUNTER — Ambulatory Visit: Payer: No Typology Code available for payment source | Admitting: Neurology

## 2018-06-12 ENCOUNTER — Other Ambulatory Visit: Payer: Self-pay | Admitting: Family Medicine

## 2018-06-12 ENCOUNTER — Encounter: Payer: Self-pay | Admitting: Family Medicine

## 2018-06-12 ENCOUNTER — Ambulatory Visit (INDEPENDENT_AMBULATORY_CARE_PROVIDER_SITE_OTHER): Payer: No Typology Code available for payment source | Admitting: Family Medicine

## 2018-06-12 VITALS — BP 136/80 | HR 70 | Temp 97.8°F | Resp 18 | Ht 66.0 in | Wt 229.0 lb

## 2018-06-12 DIAGNOSIS — G118 Other hereditary ataxias: Secondary | ICD-10-CM

## 2018-06-12 DIAGNOSIS — Z125 Encounter for screening for malignant neoplasm of prostate: Secondary | ICD-10-CM

## 2018-06-12 DIAGNOSIS — I10 Essential (primary) hypertension: Secondary | ICD-10-CM | POA: Diagnosis not present

## 2018-06-12 DIAGNOSIS — Z0000 Encounter for general adult medical examination without abnormal findings: Secondary | ICD-10-CM

## 2018-06-12 MED ORDER — OMEPRAZOLE 40 MG PO CPDR: 40.0000 mg | DELAYED_RELEASE_CAPSULE | Freq: Every day | ORAL | 3 refills | Status: DC

## 2018-06-12 NOTE — Progress Notes (Signed)
Subjective:    Patient ID: Jacob Heath, male    DOB: 15-Jan-1946, 72 y.o.   MRN: [id number]  HPI Patient is a 72 year old Caucasian male here today for a CPE. Past medical history is significant for spinocerebellar ataxia which has been diagnosed over the last 5years. Patient currently sees Dr. Krista Blue.  Workup has included an MRI of the brain, MRI of the cervical, thoracic, and lumbar spine as well as numerous lab studies and nerve conduction test. 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 The only treatment option for this includes physical therapy which he is undergone numerous occasions. He has extremely poor balance. He falls numerous times every week.  Only he has been seeing a chiropractor and he believes that this is actually helping with his balance.  He has been told that one hip is higher than another and they have been making manipulations and adjustment to correct this and he believes that this is helping his strength in his balance.  He continues to report daily constant pain on the medial aspect of the left knee.  He also reports a cough that occurs frequently.  It primarily occurs after drinking acidic beverages, eating acidic foods or eating spicy foods.  He states that there is an irritated feeling deep in his throat that these foods seem to exacerbate causing him to have a cough.  He denies any reflux.  He denies any dysphasia.  He denies any hemoptysis Past Medical History:  Diagnosis Date  . Abnormality of gait 01/01/2014  . Anxiety state, unspecified 01/01/2014  . Arthritis   . 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 Krista Blue  . Callus of heel 01/01/2014  . Constipation   . Constipation 01/01/2014  . Cutaneous skin tags 01/01/2014  . Hiatal hernia   . Hypertension 01/03/2014  . Hypertrophy of prostate without urinary obstruction and other lower urinary tract symptoms (LUTS)   . Hypertrophy of prostate without urinary obstruction and other lower urinary tract symptoms  (LUTS)   . Internal hemorrhoids   . Lipoma 01/01/2014  . Lumbago 01/01/2014  . Memory loss 01/01/2014  . OBSTRUCTIVE SLEEP APNEA 11/05/2009   npsg 1998:  AHI 23/hr, PLMS 124 with 8/hr with a/a On cpap   . OSA (obstructive sleep apnea)   . Osteoarthrosis, unspecified whether generalized or localized, lower leg   . Prediabetes   . Unspecified arthropathy, lower leg   . Wart 01/03/2014   Right 4th finger    Past Surgical History:  Procedure Laterality Date  . COLONOSCOPY  07/14/2006  . JOINT REPLACEMENT     bilateral knee replacement  . KNEE SURGERY  08/1994 & 12/1996   x 2 Dr Noemi Chapel  . NASAL SEPTUM SURGERY  2001  . Prepatellar bursectomy Right 1961  . ROTATOR CUFF REPAIR     right shoulder  . TESTICLE REMOVAL Right 2006  . TOTAL KNEE ARTHROPLASTY Bilateral LT 03/2002 & RT 02/2003   Current Outpatient Medications on File Prior to Visit  Medication Sig Dispense Refill  . clotrimazole (ANTI-FUNGAL) 1 % cream Apply 1 application topically as needed.    . diclofenac (VOLTAREN) 75 MG EC tablet TAKE 1 TABLET BY MOUTH TWICE A DAY 30 tablet 3  . UNABLE TO FIND CPAP - [facility]     No current facility-administered medications on file prior to visit.    Allergies  Allergen Reactions  . Codeine     itching   Social History   Socioeconomic History  .  Marital status: Married    Spouse name: Not on file  . Number of children: 2  . Years of education: 56  . Highest education level: Not on file  Occupational History  . Occupation: Retired    Comment: Worked from post office  Social Needs  . Financial resource strain: Not on file  . Food insecurity:    Worry: Not on file    Inability: Not on file  . Transportation needs:    Medical: Not on file    Non-medical: Not on file  Tobacco Use  . Smoking status: Former Smoker    Packs/day: 2.00    Years: 6.00    Pack years: 12.00    Types: Cigarettes    Last attempt to quit: 11/22/1964    Years since quitting: 53.5  .  Smokeless tobacco: Never Used  Substance and Sexual Activity  . Alcohol use: Yes    Alcohol/week: 0.6 oz    Types: 1 Cans of beer per week    Comment: rare  . Drug use: No  . Sexual activity: Not on file  Lifestyle  . Physical activity:    Days per week: Not on file    Minutes per session: Not on file  . Stress: Not on file  Relationships  . Social connections:    Talks on phone: Not on file    Gets together: Not on file    Attends religious service: Not on file    Active member of club or organization: Not on file    Attends meetings of clubs or organizations: Not on file    Relationship status: Not on file  . Intimate partner violence:    Fear of current or ex partner: Not on file    Emotionally abused: Not on file    Physically abused: Not on file    Forced sexual activity: Not on file  Other Topics Concern  . Not on file  Social History Narrative   Patient lives at home with his wife Threasa Beards)  and  His mother    Retired   High school education   Right handed   Family History  Problem Relation Age of Onset  . Allergies Sister   . Heart disease Mother   . Breast cancer Mother   . Heart disease Brother   . Heart disease Paternal Grandmother   . Breast cancer Paternal Grandmother   . Asthma Sister   . Stroke Maternal Grandmother   . Breast cancer Sister       Review of Systems  All other systems reviewed and are negative.      Objective:   Physical Exam  Constitutional: He is oriented to person, place, and time. He appears well-developed and well-nourished. No distress.  HENT:  Head: Normocephalic and atraumatic.  Right Ear: External ear normal.  Left Ear: External ear normal.  Nose: Nose normal.  Mouth/Throat: Oropharynx is clear and moist. No oropharyngeal exudate.  Eyes: Pupils are equal, round, and reactive to light. Conjunctivae and EOM are normal. Right eye exhibits no discharge. Left eye exhibits no discharge. No scleral icterus.  Neck: Normal  range of motion. Neck supple. No JVD present. No tracheal deviation present. No thyromegaly present.  Cardiovascular: Normal rate, regular rhythm, normal heart sounds and intact distal pulses. Exam reveals no gallop and no friction rub.  No murmur heard. Pulmonary/Chest: Effort normal and breath sounds normal. No stridor. No respiratory distress. He has no wheezes. He has no rales. He exhibits no  tenderness.  Abdominal: Soft. Bowel sounds are normal. He exhibits no distension and no mass. There is no tenderness. There is no rebound and no guarding.  Musculoskeletal:       Right knee: He exhibits decreased range of motion.       Left knee: He exhibits decreased range of motion, effusion and bony tenderness. Tenderness found. Medial joint line tenderness noted.  Lymphadenopathy:    He has no cervical adenopathy.  Neurological: He is alert and oriented to person, place, and time. He has normal reflexes. He displays no atrophy and no tremor. No cranial nerve deficit. He exhibits normal muscle tone. He displays no seizure activity. Coordination and gait abnormal.  Skin: Skin is warm and dry. No rash noted. He is not diaphoretic. No erythema. No pallor.  Vitals reviewed.         Assessment & Plan:  General medical exam  Benign essential HTN  Prostate cancer screening  Spinocerebellar ataxia ([place])  Diclofenac is provided very little relief for his knee pain.  He states that he has not taken any more than a month.  He declines tramadol or any other pain medication at the present time.  Immunizations are up-to-date today.  I did discuss a tetanus shot as well as Shingrix and recommended that he check on the price of both of these prior to receiving them to make sure that he agrees with the cost.  I will check basic lab work including a CBC, CMP, fasting lipid panel, and PSA.  I will also screen the patient for hepatitis C.  I provided him a written prescription to take this to [facility] where he  prefers.  Colonoscopy is up-to-date.  The patient is not due again until 2023.  I will try the patient empirically on omeprazole 40 mg a day for acid reflux causing possible nerve irritation in the throat leading to coughing which is exacerbated by spicy and acidic foods.  Recheck in 2 to 3 weeks to see if symptoms are improving.

## 2018-06-13 LAB — LIPID PANEL W/O CHOL/HDL RATIO
Cholesterol, Total: 179 mg/dL (ref 100–199)
HDL: 40 mg/dL (ref >39)
LDL Calculated: 107 mg/dL — ABNORMAL HIGH (ref 0–99)
Triglycerides: 160 mg/dL — ABNORMAL HIGH (ref 0–149)
VLDL Cholesterol Cal: 32 mg/dL (ref 5–40)

## 2018-06-13 LAB — PSA: Prostate Specific Ag, Serum: 2.1 ng/mL (ref 0.0–4.0)

## 2018-06-13 LAB — COMPREHENSIVE METABOLIC PANEL
ALT: 17 IU/L (ref 0–44)
AST: 14 IU/L (ref 0–40)
Albumin/Globulin Ratio: 1.4 (ref 1.2–2.2)
Albumin: 4.2 g/dL (ref 3.5–4.8)
Alkaline Phosphatase: 108 IU/L (ref 39–117)
BUN/Creatinine Ratio: 13 (ref 10–24)
BUN: 13 mg/dL (ref 8–27)
Bilirubin Total: 0.6 mg/dL (ref 0.0–1.2)
CO2: 23 mmol/L (ref 20–29)
Calcium: 9.1 mg/dL (ref 8.6–10.2)
Chloride: 104 mmol/L (ref 96–106)
Creatinine, Ser: 1.04 mg/dL (ref 0.76–1.27)
GFR calc Af Amer: 83 mL/min/{1.73_m2} (ref >59)
GFR calc non Af Amer: 71 mL/min/{1.73_m2} (ref >59)
Globulin, Total: 3.1 g/dL (ref 1.5–4.5)
Glucose: 110 mg/dL — ABNORMAL HIGH (ref 65–99)
Potassium: 4.6 mmol/L (ref 3.5–5.2)
Sodium: 141 mmol/L (ref 134–144)
Total Protein: 7.3 g/dL (ref 6.0–8.5)

## 2018-06-13 LAB — CBC/DIFF AMBIGUOUS DEFAULT
Basophils Absolute: 0 10*3/uL (ref 0.0–0.2)
Basos: 0 %
EOS (ABSOLUTE): 0.2 10*3/uL (ref 0.0–0.4)
Eos: 3 %
Hematocrit: 41.2 % (ref 37.5–51.0)
Hemoglobin: 13 g/dL (ref 13.0–17.7)
Immature Grans (Abs): 0 10*3/uL (ref 0.0–0.1)
Immature Granulocytes: 0 %
Lymphocytes Absolute: 2.7 10*3/uL (ref 0.7–3.1)
Lymphs: 40 %
MCH: 26.6 pg (ref 26.6–33.0)
MCHC: 31.6 g/dL (ref 31.5–35.7)
MCV: 84 fL (ref 79–97)
Monocytes Absolute: 0.4 10*3/uL (ref 0.1–0.9)
Monocytes: 6 %
Neutrophils Absolute: 3.5 10*3/uL (ref 1.4–7.0)
Neutrophils: 51 %
Platelets: 300 10*3/uL (ref 150–450)
RBC: 4.88 x10E6/uL (ref 4.14–5.80)
RDW: 15.8 % — ABNORMAL HIGH (ref 12.3–15.4)
WBC: 6.8 10*3/uL (ref 3.4–10.8)

## 2018-06-13 LAB — SPECIMEN STATUS REPORT

## 2018-06-13 LAB — HEPATITIS C ANTIBODY: Hep C Virus Ab: <0.1 s/co ratio (ref 0.0–0.9)

## 2018-06-15 ENCOUNTER — Encounter: Payer: Self-pay | Admitting: Family Medicine

## 2018-07-26 ENCOUNTER — Other Ambulatory Visit: Payer: Self-pay | Admitting: Family Medicine

## 2018-08-22 ENCOUNTER — Ambulatory Visit (INDEPENDENT_AMBULATORY_CARE_PROVIDER_SITE_OTHER): Payer: No Typology Code available for payment source

## 2018-08-22 DIAGNOSIS — Z23 Encounter for immunization: Secondary | ICD-10-CM

## 2018-08-22 NOTE — Progress Notes (Signed)
Patient was office for high dose flu vaccine and received vaccine in his left Deltoid. Patient tolerated well. While patient was in the office he states his pharmacy requested a rx for a cpap mask. Dr. Dennard Schaumann signed the order and I will fax over to [facility] as requested by patient

## 2019-01-25 ENCOUNTER — Encounter: Payer: Self-pay | Admitting: Family Medicine

## 2019-01-25 ENCOUNTER — Ambulatory Visit (INDEPENDENT_AMBULATORY_CARE_PROVIDER_SITE_OTHER): Payer: No Typology Code available for payment source | Admitting: Family Medicine

## 2019-01-25 VITALS — BP 130/80 | HR 88 | Temp 97.7°F | Resp 18 | Ht 66.0 in | Wt 234.0 lb

## 2019-01-25 DIAGNOSIS — J324 Chronic pansinusitis: Secondary | ICD-10-CM | POA: Diagnosis not present

## 2019-01-25 MED ORDER — LEVOCETIRIZINE DIHYDROCHLORIDE 5 MG PO TABS: 5.0000 mg | ORAL_TABLET | Freq: Every evening | ORAL | 0 refills | Status: DC

## 2019-01-25 MED ORDER — FLUTICASONE PROPIONATE 50 MCG/ACT NA SUSP: 2.0000 | Freq: Every day | NASAL | 6 refills | Status: DC

## 2019-01-25 NOTE — Progress Notes (Signed)
Subjective:    Patient ID: Jacob Heath, male    DOB: 1946-10-15, 73 y.o.   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 Patient currently sees Dr. Krista Blue.  Workup has included an MRI of the brain, MRI of the cervical, thoracic, and lumbar spine as well as numerous lab studies and nerve conduction test. 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 Presents today with chronic sinus drainage.  He states that he developed a head cold in early January.  Since that time he reports constant clear rhinorrhea.  He does occasionally have pressure in his frontal and maxillary sinuses when he bends over however he denies any significant headaches or significant sinus pain.  He denies any tenderness to palpation over his sinuses today with percussion.  The rhinorrhea is clear.  He reports postnasal drip.  He reports sneezing.  Occasionally has itchy watery eyes.  He denies any otalgia.  He does have some scratchy throat when he had throat irritation last year that we treated with acid reflux medication and it went away suggesting irritation from some type of irritant either from postnasal drip or untreated reflux. Past Medical History:  Diagnosis Date  . Abnormality of gait 01/01/2014  . Anxiety state, unspecified 01/01/2014  . Arthritis   . 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 Krista Blue  . Callus of heel 01/01/2014  . Constipation   . Constipation 01/01/2014  . Cutaneous skin tags 01/01/2014  . Hiatal hernia   . Hypertension 01/03/2014  . Hypertrophy of prostate without urinary obstruction and other lower urinary tract symptoms (LUTS)   . Hypertrophy of prostate without urinary obstruction and other lower urinary tract symptoms (LUTS)   . Internal hemorrhoids   . Lipoma 01/01/2014  . Lumbago 01/01/2014  . Memory loss 01/01/2014  . OBSTRUCTIVE SLEEP APNEA 11/05/2009   npsg 1998:  AHI 23/hr, PLMS 124 with 8/hr with a/a On cpap   . OSA (obstructive sleep apnea)   . Osteoarthrosis, unspecified whether generalized or localized, lower leg   . Prediabetes   . Unspecified arthropathy, lower leg   . Wart 01/03/2014   Right 4th finger    Past Surgical History:  Procedure Laterality Date  . COLONOSCOPY  07/14/2006  . JOINT REPLACEMENT     bilateral knee replacement  . KNEE SURGERY  08/1994 & 12/1996   x 2 Dr Noemi Chapel  . NASAL SEPTUM SURGERY  2001  . Prepatellar bursectomy Right 1961  . ROTATOR CUFF REPAIR     right shoulder  . TESTICLE REMOVAL Right 2006  . TOTAL KNEE ARTHROPLASTY Bilateral LT 03/2002 & RT 02/2003   Current Outpatient Medications on File Prior to Visit  Medication Sig Dispense Refill  . clotrimazole (ANTI-FUNGAL) 1 % cream Apply 1 application topically as needed.    . diclofenac (VOLTAREN) 75 MG EC tablet TAKE 1 TABLET BY MOUTH TWICE A DAY 30 tablet 3  . omeprazole (PRILOSEC) 40 MG capsule Take 1 capsule (40 mg total) by mouth daily. 30 capsule 3  . UNABLE TO FIND CPAP - [facility]     No current facility-administered medications on file prior to visit.    Allergies  Allergen Reactions  . Codeine     itching   Social History   Socioeconomic History  . Marital status: Married    Spouse name: Not on file  . Number of  children: 2  . Years of education: 22  . Highest education level: Not on file  Occupational History  . Occupation: Retired    Comment: Worked from post office  Social Needs  . Financial resource strain: Not on file  . Food insecurity:    Worry: Not on file    Inability: Not on file  . Transportation needs:    Medical: Not on file    Non-medical: Not on file  Tobacco Use  . Smoking status: Former Smoker    Packs/day: 2.00    Years: 6.00    Pack years: 12.00    Types: Cigarettes    Last attempt to quit: 11/22/1964    Years since quitting: 54.2  . Smokeless tobacco: Never Used  Substance and Sexual  Activity  . Alcohol use: Yes    Alcohol/week: 1.0 standard drinks    Types: 1 Cans of beer per week    Comment: rare  . Drug use: No  . Sexual activity: Not on file  Lifestyle  . Physical activity:    Days per week: Not on file    Minutes per session: Not on file  . Stress: Not on file  Relationships  . Social connections:    Talks on phone: Not on file    Gets together: Not on file    Attends religious service: Not on file    Active member of club or organization: Not on file    Attends meetings of clubs or organizations: Not on file    Relationship status: Not on file  . Intimate partner violence:    Fear of current or ex partner: Not on file    Emotionally abused: Not on file    Physically abused: Not on file    Forced sexual activity: Not on file  Other Topics Concern  . Not on file  Social History Narrative   Patient lives at home with his wife Threasa Beards)  and  His mother    Retired   High school education   Right handed   Family History  Problem Relation Age of Onset  . Allergies Sister   . Heart disease Mother   . Breast cancer Mother   . Heart disease Brother   . Heart disease Paternal Grandmother   . Breast cancer Paternal Grandmother   . Asthma Sister   . Stroke Maternal Grandmother   . Breast cancer Sister       Review of Systems  All other systems reviewed and are negative.      Objective:   Physical Exam  Constitutional: He is oriented to person, place, and time. He appears well-developed and well-nourished. No distress.  HENT:  Head: Normocephalic and atraumatic.  Right Ear: Tympanic membrane, external ear and ear canal normal.  Left Ear: Tympanic membrane, external ear and ear canal normal. No decreased hearing is noted.  Nose: Mucosal edema and rhinorrhea present. Right sinus exhibits no maxillary sinus tenderness and no frontal sinus tenderness. Left sinus exhibits no maxillary sinus tenderness and no frontal sinus tenderness.    Mouth/Throat: Oropharynx is clear and moist. No oropharyngeal exudate.  Eyes: Pupils are equal, round, and reactive to light. Conjunctivae and EOM are normal. Right eye exhibits no discharge. Left eye exhibits no discharge. No scleral icterus.  Neck: Neck supple. No tracheal deviation present.  Cardiovascular: Normal rate, regular rhythm, normal heart sounds and intact distal pulses. Exam reveals no gallop and no friction rub.  No murmur heard. Pulmonary/Chest: Effort normal and  breath sounds normal. No respiratory distress. He has no wheezes. He has no rales. He exhibits no tenderness.  Abdominal: Soft. Bowel sounds are normal. There is no abdominal tenderness.  Lymphadenopathy:    He has no cervical adenopathy.  Neurological: He is alert and oriented to person, place, and time. He displays no atrophy and no tremor. He displays no seizure activity. Gait abnormal.  Skin: He is not diaphoretic.  Vitals reviewed.         Assessment & Plan:  Chronic sinusitis I suspect due to allergies  Try Xyzal 5 mg daily coupled with Flonase 2 sprays each nostril daily and reassess in 1 to 2 weeks.  If symptoms persist, consider chronic sinus infection and treatment with prednisone and amoxicillin.  If sore scratchy throat does not improve with allergy medication, I would likely encourage him to resume his PPI and if no better at that point consider laryngoscopy

## 2019-02-17 ENCOUNTER — Other Ambulatory Visit: Payer: Self-pay | Admitting: Family Medicine

## 2019-06-15 ENCOUNTER — Other Ambulatory Visit: Payer: Self-pay

## 2019-06-18 ENCOUNTER — Ambulatory Visit (INDEPENDENT_AMBULATORY_CARE_PROVIDER_SITE_OTHER): Payer: No Typology Code available for payment source | Admitting: Family Medicine

## 2019-06-18 ENCOUNTER — Encounter: Payer: Self-pay | Admitting: Family Medicine

## 2019-06-18 VITALS — BP 128/64 | HR 76 | Temp 98.1°F | Resp 16 | Ht 66.0 in | Wt 234.0 lb

## 2019-06-18 DIAGNOSIS — G118 Other hereditary ataxias: Secondary | ICD-10-CM | POA: Diagnosis not present

## 2019-06-18 DIAGNOSIS — I10 Essential (primary) hypertension: Secondary | ICD-10-CM

## 2019-06-18 DIAGNOSIS — Z125 Encounter for screening for malignant neoplasm of prostate: Secondary | ICD-10-CM

## 2019-06-18 DIAGNOSIS — Z0001 Encounter for general adult medical examination with abnormal findings: Secondary | ICD-10-CM | POA: Diagnosis not present

## 2019-06-18 DIAGNOSIS — R7303 Prediabetes: Secondary | ICD-10-CM

## 2019-06-18 DIAGNOSIS — Z0000 Encounter for general adult medical examination without abnormal findings: Secondary | ICD-10-CM

## 2019-06-18 DIAGNOSIS — R011 Cardiac murmur, unspecified: Secondary | ICD-10-CM

## 2019-06-18 NOTE — Progress Notes (Signed)
Subjective:    Patient ID: Jacob Heath, male    DOB: 1946/08/17, 73 y.o.   MRN: [id number]  HPI Patient is a 73 year old Caucasian male here today for a CPE. 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 Patient currently sees Dr. Krista Blue.  Workup has included an MRI of the brain, MRI of the cervical, thoracic, and lumbar spine as well as numerous lab studies and nerve conduction test. 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 The only treatment option for this includes physical therapy which he is undergone numerous occasions. He has extremely poor balance. He falls numerous times every week.  Last colonoscopy was March 08, 2012.  Colonoscopy was normal and next colonoscopy was recommended in 2023.  Last PSA was in 2018.  This is overdue to screen for prostate cancer.  Immunization records are listed below: Immunization History  Administered Date(s) Administered  . Influenza, High Dose Seasonal PF 08/30/2017, 08/22/2018  . Influenza,inj,Quad PF,6+ Mos 01/21/2015, 07/29/2015, 07/28/2016  . Influenza-Unspecified 10/03/2013  . Pneumococcal Conjugate-13 07/29/2015  . Pneumococcal Polysaccharide-23 01/01/2014  . Td 07/11/1997  . Tdap 10/04/2007  . Zoster 04/09/2014   Immunizations are up-to-date except for tetanus shot and Shingrix.  Past Medical History:  Diagnosis Date  . Abnormality of gait 01/01/2014  . Anxiety state, unspecified 01/01/2014  . Arthritis   . 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 Krista Blue  . Callus of heel 01/01/2014  . Constipation   . Constipation 01/01/2014  . Cutaneous skin tags 01/01/2014  . Hiatal hernia   . Hypertension 01/03/2014  . Hypertrophy of prostate without urinary obstruction and other lower urinary tract symptoms (LUTS)   . Hypertrophy of prostate without urinary obstruction and other lower urinary tract symptoms (LUTS)   . Internal hemorrhoids   . Lipoma 01/01/2014  . Lumbago  01/01/2014  . Memory loss 01/01/2014  . OBSTRUCTIVE SLEEP APNEA 11/05/2009   npsg 1998:  AHI 23/hr, PLMS 124 with 8/hr with a/a On cpap   . OSA (obstructive sleep apnea)   . Osteoarthrosis, unspecified whether generalized or localized, lower leg   . Prediabetes   . Unspecified arthropathy, lower leg   . Wart 01/03/2014   Right 4th finger    Past Surgical History:  Procedure Laterality Date  . COLONOSCOPY  07/14/2006  . JOINT REPLACEMENT     bilateral knee replacement  . KNEE SURGERY  08/1994 & 12/1996   x 2 Dr Noemi Chapel  . NASAL SEPTUM SURGERY  2001  . Prepatellar bursectomy Right 1961  . ROTATOR CUFF REPAIR     right shoulder  . TESTICLE REMOVAL Right 2006  . TOTAL KNEE ARTHROPLASTY Bilateral LT 03/2002 & RT 02/2003   Current Outpatient Medications on File Prior to Visit  Medication Sig Dispense Refill  . UNABLE TO FIND CPAP - [facility]    . diclofenac (VOLTAREN) 75 MG EC tablet TAKE 1 TABLET BY MOUTH TWICE A DAY (Patient not taking: Reported on 06/18/2019) 30 tablet 3   No current facility-administered medications on file prior to visit.    Allergies  Allergen Reactions  . Codeine     itching   Social History   Socioeconomic History  . Marital status: Married    Spouse name: Not on file  . Number of children: 2  . Years of education: 61  . Highest education level: Not on file  Occupational History  . Occupation: Retired    Comment:  Worked from post office  Social Needs  . Financial resource strain: Not on file  . Food insecurity    Worry: Not on file    Inability: Not on file  . Transportation needs    Medical: Not on file    Non-medical: Not on file  Tobacco Use  . Smoking status: Former Smoker    Packs/day: 2.00    Years: 6.00    Pack years: 12.00    Types: Cigarettes    Quit date: 11/22/1964    Years since quitting: 54.6  . Smokeless tobacco: Never Used  Substance and Sexual Activity  . Alcohol use: Yes    Alcohol/week: 1.0 standard drinks     Types: 1 Cans of beer per week    Comment: rare  . Drug use: No  . Sexual activity: Not on file  Lifestyle  . Physical activity    Days per week: Not on file    Minutes per session: Not on file  . Stress: Not on file  Relationships  . Social Herbalist on phone: Not on file    Gets together: Not on file    Attends religious service: Not on file    Active member of club or organization: Not on file    Attends meetings of clubs or organizations: Not on file    Relationship status: Not on file  . Intimate partner violence    Fear of current or ex partner: Not on file    Emotionally abused: Not on file    Physically abused: Not on file    Forced sexual activity: Not on file  Other Topics Concern  . Not on file  Social History Narrative   Patient lives at home with his wife Threasa Beards)  and  His mother    Retired   High school education   Right handed   Family History  Problem Relation Age of Onset  . Allergies Sister   . Heart disease Mother   . Breast cancer Mother   . Heart disease Brother   . Heart disease Paternal Grandmother   . Breast cancer Paternal Grandmother   . Asthma Sister   . Stroke Maternal Grandmother   . Breast cancer Sister       Review of Systems  All other systems reviewed and are negative.      Objective:   Physical Exam  Constitutional: He is oriented to person, place, and time. He appears well-developed and well-nourished. No distress.  HENT:  Head: Normocephalic and atraumatic.  Right Ear: External ear normal.  Left Ear: External ear normal.  Nose: Nose normal.  Mouth/Throat: Oropharynx is clear and moist. No oropharyngeal exudate.  Eyes: Pupils are equal, round, and reactive to light. Conjunctivae and EOM are normal. Right eye exhibits no discharge. Left eye exhibits no discharge. No scleral icterus.  Neck: Normal range of motion. Neck supple. No JVD present. No tracheal deviation present. No thyromegaly present.   Cardiovascular: Normal rate, regular rhythm and intact distal pulses. Exam reveals no gallop and no friction rub.  Murmur heard. Pulmonary/Chest: Effort normal and breath sounds normal. No stridor. No respiratory distress. He has no wheezes. He has no rales. He exhibits no tenderness.  Abdominal: Soft. Bowel sounds are normal. He exhibits no distension and no mass. There is no abdominal tenderness. There is no rebound and no guarding.  Genitourinary:    Rectum normal.  Prostate is enlarged. Prostate is not tender.  Musculoskeletal:  Right knee: He exhibits decreased range of motion.     Left knee: He exhibits decreased range of motion, effusion and bony tenderness. Tenderness found. Medial joint line tenderness noted.  Lymphadenopathy:    He has no cervical adenopathy.  Neurological: He is alert and oriented to person, place, and time. He has normal reflexes. He displays no atrophy and no tremor. No cranial nerve deficit. He exhibits normal muscle tone. He displays no seizure activity. Coordination and gait abnormal.  Skin: Skin is warm and dry. No rash noted. He is not diaphoretic. No erythema. No pallor.  Vitals reviewed.         Assessment & Plan:  1. General medical exam Colonoscopy is up-to-date.  This is not due again until 2023.  Prostate exam shows BPH I will evaluate for possible cancer by checking a PSA but no nodularity is appreciated on exam today.  I recommended a tetanus vaccine as well as the shingles shot when available.  The remainder of his preventative care is up-to-date.  He denies any memory loss or depression although he is experiencing balance issues and falls related to a spinocerebellar ataxia.  2. Spinocerebellar ataxia ([place]) Patient is seen to neurologist and they state that there is nothing else that they can do.  I have recommended weight loss as a means to help prevent future falls by improving his conditioning.  I believe the less he weighs the better his  muscles will be able to compensate for his balance issues.  Patient has a history of obstructive sleep apnea.  The current machine that he is using is more than 74 years old.  The humidifier is no longer working.  Therefore I will see if we can get him new equipment through [facility].  Patient is compliant and wears it for more than 6 hours every night per his report 100% of the time.  3. Prostate cancer screening Prostate is enlarged.  Check PSA - PSA  4. Benign essential HTN Blood pressure is well controlled.  Check fasting lipid panel. - Hemoglobin A1c - CBC with Differential/Platelet - COMPLETE METABOLIC PANEL WITH GFR - Lipid panel  5. Prediabetes Patient has a remote history of prediabetes, will check a hemoglobin A1c. - Hemoglobin A1c - CBC with Differential/Platelet - COMPLETE METABOLIC PANEL WITH GFR - Lipid panel  6. Murmur Murmur is heard best over his aortic valve.  This is new.  I will obtain an echocardiogram although I suspect aortic stenosis - ECHOCARDIOGRAM COMPLETE; Future

## 2019-06-19 ENCOUNTER — Other Ambulatory Visit: Payer: Self-pay

## 2019-06-19 ENCOUNTER — Encounter: Payer: Self-pay | Admitting: Family Medicine

## 2019-06-19 ENCOUNTER — Ambulatory Visit (HOSPITAL_COMMUNITY): Payer: No Typology Code available for payment source | Attending: Internal Medicine

## 2019-06-19 DIAGNOSIS — R011 Cardiac murmur, unspecified: Secondary | ICD-10-CM | POA: Insufficient documentation

## 2019-06-19 DIAGNOSIS — E785 Hyperlipidemia, unspecified: Secondary | ICD-10-CM | POA: Insufficient documentation

## 2019-06-19 DIAGNOSIS — E118 Type 2 diabetes mellitus with unspecified complications: Secondary | ICD-10-CM | POA: Insufficient documentation

## 2019-06-19 LAB — CBC WITH DIFFERENTIAL/PLATELET
Absolute Monocytes: 676 cells/uL (ref 200–950)
Basophils Absolute: 30 cells/uL (ref 0–200)
Basophils Relative: 0.4 %
Eosinophils Absolute: 213 cells/uL (ref 15–500)
Eosinophils Relative: 2.8 %
HCT: 40 % (ref 38.5–50.0)
Hemoglobin: 12.7 g/dL — ABNORMAL LOW (ref 13.2–17.1)
Lymphs Abs: 2782 cells/uL (ref 850–3900)
MCH: 25.8 pg — ABNORMAL LOW (ref 27.0–33.0)
MCHC: 31.8 g/dL — ABNORMAL LOW (ref 32.0–36.0)
MCV: 81.1 fL (ref 80.0–100.0)
MPV: 11.1 fL (ref 7.5–12.5)
Monocytes Relative: 8.9 %
Neutro Abs: 3899 cells/uL (ref 1500–7800)
Neutrophils Relative %: 51.3 %
Platelets: 303 10*3/uL (ref 140–400)
RBC: 4.93 10*6/uL (ref 4.20–5.80)
RDW: 15.5 % — ABNORMAL HIGH (ref 11.0–15.0)
Total Lymphocyte: 36.6 %
WBC: 7.6 10*3/uL (ref 3.8–10.8)

## 2019-06-19 LAB — LIPID PANEL
Cholesterol: 181 mg/dL (ref <200)
HDL: 42 mg/dL (ref >=40)
LDL Cholesterol (Calc): 116 mg/dL (calc) — ABNORMAL HIGH
Non-HDL Cholesterol (Calc): 139 mg/dL (calc) — ABNORMAL HIGH (ref <130)
Total CHOL/HDL Ratio: 4.3 (calc) (ref <5.0)
Triglycerides: 119 mg/dL (ref <150)

## 2019-06-19 LAB — COMPLETE METABOLIC PANEL WITH GFR
AG Ratio: 1.4 (calc) (ref 1.0–2.5)
ALT: 15 U/L (ref 9–46)
AST: 14 U/L (ref 10–35)
Albumin: 4.3 g/dL (ref 3.6–5.1)
Alkaline phosphatase (APISO): 84 U/L (ref 35–144)
BUN/Creatinine Ratio: 16 (calc) (ref 6–22)
BUN: 19 mg/dL (ref 7–25)
CO2: 23 mmol/L (ref 20–32)
Calcium: 8.8 mg/dL (ref 8.6–10.3)
Chloride: 105 mmol/L (ref 98–110)
Creat: 1.22 mg/dL — ABNORMAL HIGH (ref 0.70–1.18)
GFR, Est African American: 68 mL/min/{1.73_m2} (ref >=60)
GFR, Est Non African American: 58 mL/min/{1.73_m2} — ABNORMAL LOW (ref >=60)
Globulin: 3 g/dL (calc) (ref 1.9–3.7)
Glucose, Bld: 139 mg/dL — ABNORMAL HIGH (ref 65–99)
Potassium: 4.2 mmol/L (ref 3.5–5.3)
Sodium: 140 mmol/L (ref 135–146)
Total Bilirubin: 0.8 mg/dL (ref 0.2–1.2)
Total Protein: 7.3 g/dL (ref 6.1–8.1)

## 2019-06-19 LAB — HEMOGLOBIN A1C
Hgb A1c MFr Bld: 7 % of total Hgb — ABNORMAL HIGH (ref <5.7)
Mean Plasma Glucose: 154 (calc)
eAG (mmol/L): 8.5 (calc)

## 2019-06-19 LAB — PSA: PSA: 2.2 ng/mL (ref <=4.0)

## 2019-06-20 ENCOUNTER — Encounter: Payer: Self-pay | Admitting: Family Medicine

## 2019-06-20 ENCOUNTER — Other Ambulatory Visit: Payer: Self-pay | Admitting: Family Medicine

## 2019-06-20 MED ORDER — ATORVASTATIN CALCIUM 20 MG PO TABS: 20.0000 mg | ORAL_TABLET | Freq: Every day | ORAL | 3 refills | Status: DC

## 2019-06-20 MED ORDER — SITAGLIPTIN PHOSPHATE 100 MG PO TABS: 100.0000 mg | ORAL_TABLET | Freq: Every day | ORAL | 1 refills | Status: DC

## 2019-06-21 ENCOUNTER — Other Ambulatory Visit: Payer: Self-pay | Admitting: Family Medicine

## 2019-06-21 DIAGNOSIS — G4733 Obstructive sleep apnea (adult) (pediatric): Secondary | ICD-10-CM

## 2019-06-25 ENCOUNTER — Telehealth: Payer: Self-pay | Admitting: Family Medicine

## 2019-06-25 NOTE — Telephone Encounter (Signed)
Pt went to get Januvia filled and it is $600 / 3 months supply and he can not afford that. Is there something cheaper we can give him instead?

## 2019-06-25 NOTE — Telephone Encounter (Signed)
Metformin 500 bid

## 2019-06-27 MED ORDER — METFORMIN HCL 500 MG PO TABS: 500.0000 mg | ORAL_TABLET | Freq: Two times a day (BID) | ORAL | 1 refills | Status: DC

## 2019-06-27 NOTE — Telephone Encounter (Signed)
Med sent to pharm and pt aware via vm 

## 2019-09-20 ENCOUNTER — Other Ambulatory Visit: Payer: Self-pay

## 2019-09-21 ENCOUNTER — Ambulatory Visit (INDEPENDENT_AMBULATORY_CARE_PROVIDER_SITE_OTHER): Payer: No Typology Code available for payment source | Admitting: Family Medicine

## 2019-09-21 ENCOUNTER — Encounter: Payer: Self-pay | Admitting: Family Medicine

## 2019-09-21 VITALS — BP 132/72 | HR 80 | Temp 98.2°F | Resp 18 | Ht 66.0 in | Wt 223.0 lb

## 2019-09-21 DIAGNOSIS — I10 Essential (primary) hypertension: Secondary | ICD-10-CM

## 2019-09-21 DIAGNOSIS — E118 Type 2 diabetes mellitus with unspecified complications: Secondary | ICD-10-CM | POA: Diagnosis not present

## 2019-09-21 DIAGNOSIS — E7800 Pure hypercholesterolemia, unspecified: Secondary | ICD-10-CM

## 2019-09-21 MED ORDER — ZOSTER VAC RECOMB ADJUVANTED 50 MCG/0.5ML IM SUSR: 0.5000 mL | Freq: Once | INTRAMUSCULAR | 1 refills | Status: AC

## 2019-09-21 MED ORDER — TAMSULOSIN HCL 0.4 MG PO CAPS: 0.4000 mg | ORAL_CAPSULE | Freq: Every day | ORAL | 3 refills | Status: DC

## 2019-09-21 NOTE — Progress Notes (Signed)
Subjective:    Patient ID: Jacob Heath, male    DOB: 05-19-1946, 73 y.o.   MRN: [id number]  HPI At the patient's last visit, his hemoglobin A1c was elevated at 7.  Given his medical comorbidities I recommended adding Januvia 100 mg a day and rechecking in 3 months.  His LDL cholesterol was also elevated and given the development of diabetes I recommended a statin, Lipitor 20 mg a day.  He is here today for follow-up.  Januvia was ridiculously expensive and over $800!Marland Kitchen  Therefore the patient was started on Metformin.  He seems to be tolerating the medication well.  He states occasionally he will get a queasy stomach and some diarrhea but overall not bad.  He was also started on Lipitor and he denies any myalgias or right upper quadrant pain on that.  He is here today to recheck his A1c.  He is tried dramatically reducing his bread intake.  He is also avoiding all sodas and sweets.  He admits that during the summer he was eating a lot of fruits and he believes that is why his sugars got worse.  His blood pressure is well controlled today.  He denies any neuropathy in his feet.  His diabetic foot exam is completely normal.  He had his flu shot at an outside pharmacy.  1 concern is that he is having lower urinary tract symptoms.  He reports increasing nocturia as well as hesitancy.  He reports a weak stream.  This is gradually been worsening.  His PSA in July was 2.2.  He denies any dysuria or hematuria Past Medical History:  Diagnosis Date  . Arthritis   . 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 Krista Blue  . Cutaneous skin tags 01/01/2014  . Diabetes mellitus type 2 with complications ([place])   . Hiatal hernia   . HLD (hyperlipidemia)   . Hypertension 01/03/2014  . Hypertrophy of prostate without urinary obstruction and other lower urinary tract symptoms (LUTS)   . Internal hemorrhoids   . Lipoma 01/01/2014  . Lumbago 01/01/2014  . OBSTRUCTIVE SLEEP APNEA 11/05/2009   npsg 1998:  AHI 23/hr, PLMS 124 with 8/hr with a/a On  cpap   . Osteoarthrosis, unspecified whether generalized or localized, lower leg   . Unspecified arthropathy, lower leg   . Wart 01/03/2014   Right 4th finger    Past Surgical History:  Procedure Laterality Date  . COLONOSCOPY  07/14/2006  . JOINT REPLACEMENT     bilateral knee replacement  . KNEE SURGERY  08/1994 & 12/1996   x 2 Dr Noemi Chapel  . NASAL SEPTUM SURGERY  2001  . Prepatellar bursectomy Right 1961  . ROTATOR CUFF REPAIR     right shoulder  . TESTICLE REMOVAL Right 2006  . TOTAL KNEE ARTHROPLASTY Bilateral LT 03/2002 & RT 02/2003   Current Outpatient Medications on File Prior to Visit  Medication Sig Dispense Refill  . atorvastatin (LIPITOR) 20 MG tablet Take 1 tablet (20 mg total) by mouth daily. 90 tablet 3  . metFORMIN (GLUCOPHAGE) 500 MG tablet Take 1 tablet (500 mg total) by mouth 2 (two) times daily with a meal. 180 tablet 1  . UNABLE TO FIND CPAP - [facility]     No current facility-administered medications on file prior to visit.    Allergies  Allergen Reactions  . Codeine     itching   Social History   Socioeconomic History  . Marital status: Married    Spouse name: Not on  file  . Number of children: 2  . Years of education: 8  . Highest education level: Not on file  Occupational History  . Occupation: Retired    Comment: Worked from post office  Social Needs  . Financial resource strain: Not on file  . Food insecurity    Worry: Not on file    Inability: Not on file  . Transportation needs    Medical: Not on file    Non-medical: Not on file  Tobacco Use  . Smoking status: Former Smoker    Packs/day: 2.00    Years: 6.00    Pack years: 12.00    Types: Cigarettes    Quit date: 11/22/1964    Years since quitting: 54.8  . Smokeless tobacco: Never Used  Substance and Sexual Activity  . Alcohol use: Yes    Alcohol/week: 1.0 standard drinks    Types: 1 Cans of beer per week    Comment: rare  . Drug use: No  . Sexual activity: Not on file   Lifestyle  . Physical activity    Days per week: Not on file    Minutes per session: Not on file  . Stress: Not on file  Relationships  . Social Herbalist on phone: Not on file    Gets together: Not on file    Attends religious service: Not on file    Active member of club or organization: Not on file    Attends meetings of clubs or organizations: Not on file    Relationship status: Not on file  . Intimate partner violence    Fear of current or ex partner: Not on file    Emotionally abused: Not on file    Physically abused: Not on file    Forced sexual activity: Not on file  Other Topics Concern  . Not on file  Social History Narrative   Patient lives at home with his wife Threasa Beards)  and  His mother    Retired   High school education   Right handed      Review of Systems     Objective:   Physical Exam Vitals signs reviewed.  Constitutional:      Appearance: He is obese.  Cardiovascular:     Rate and Rhythm: Normal rate and regular rhythm.     Pulses: Normal pulses.     Heart sounds: Normal heart sounds. No murmur. No friction rub. No gallop.   Pulmonary:     Effort: Pulmonary effort is normal. No respiratory distress.     Breath sounds: Normal breath sounds. No stridor. No wheezing, rhonchi or rales.  Abdominal:     General: Abdomen is flat. Bowel sounds are normal. There is no distension.     Palpations: Abdomen is soft.     Tenderness: There is no abdominal tenderness. There is no guarding or rebound.  Musculoskeletal:     Right lower leg: No edema.     Left lower leg: No edema.  Neurological:     Mental Status: He is alert.           Assessment & Plan:  Benign essential HTN  Controlled type 2 diabetes mellitus with complication, without long-term current use of insulin (HCC) - Plan: Hemoglobin A1c, COMPLETE METABOLIC PANEL WITH GFR, Lipid panel, Microalbumin, urine  Pure hypercholesterolemia  Flu shot is up-to-date.  The lower urinary  tract symptoms sound like BPH.  We will try Flomax 0.4 mg daily and he will  recheck via telephone in 1 month.  Diabetic foot exam was performed today and is normal.  Check a hemoglobin A1c on the Metformin.  Goal hemoglobin A1c is less than 6.5.  Check a fasting lipid panel.  Goal LDL cholesterol on the Lipitor is less than 100.  He seems to be tolerating both these medications without difficulty.  Recommended less than 45 g of carbohydrates per meal.  Explained carb counting.  Flu shot is up-to-date.

## 2019-09-22 LAB — COMPLETE METABOLIC PANEL WITH GFR
AG Ratio: 1.4 (calc) (ref 1.0–2.5)
ALT: 11 U/L (ref 9–46)
AST: 11 U/L (ref 10–35)
Albumin: 4.2 g/dL (ref 3.6–5.1)
Alkaline phosphatase (APISO): 87 U/L (ref 35–144)
BUN: 20 mg/dL (ref 7–25)
CO2: 22 mmol/L (ref 20–32)
Calcium: 9.4 mg/dL (ref 8.6–10.3)
Chloride: 106 mmol/L (ref 98–110)
Creat: 1.03 mg/dL (ref 0.70–1.18)
GFR, Est African American: 83 mL/min/{1.73_m2} (ref >=60)
GFR, Est Non African American: 72 mL/min/{1.73_m2} (ref >=60)
Globulin: 2.9 g/dL (calc) (ref 1.9–3.7)
Glucose, Bld: 115 mg/dL — ABNORMAL HIGH (ref 65–99)
Potassium: 4.5 mmol/L (ref 3.5–5.3)
Sodium: 139 mmol/L (ref 135–146)
Total Bilirubin: 1 mg/dL (ref 0.2–1.2)
Total Protein: 7.1 g/dL (ref 6.1–8.1)

## 2019-09-22 LAB — LIPID PANEL
Cholesterol: 115 mg/dL (ref <200)
HDL: 39 mg/dL — ABNORMAL LOW (ref >=40)
LDL Cholesterol (Calc): 57 mg/dL (calc)
Non-HDL Cholesterol (Calc): 76 mg/dL (calc) (ref <130)
Total CHOL/HDL Ratio: 2.9 (calc) (ref <5.0)
Triglycerides: 106 mg/dL (ref <150)

## 2019-09-22 LAB — MICROALBUMIN, URINE: Microalb, Ur: <0.2 mg/dL

## 2019-09-22 LAB — HEMOGLOBIN A1C
Hgb A1c MFr Bld: 6.2 % of total Hgb — ABNORMAL HIGH (ref <5.7)
Mean Plasma Glucose: 131 (calc)
eAG (mmol/L): 7.3 (calc)

## 2019-09-24 ENCOUNTER — Encounter: Payer: Self-pay | Admitting: Family Medicine

## 2019-12-24 ENCOUNTER — Encounter: Payer: Self-pay | Admitting: Family Medicine

## 2019-12-24 ENCOUNTER — Ambulatory Visit (INDEPENDENT_AMBULATORY_CARE_PROVIDER_SITE_OTHER): Payer: No Typology Code available for payment source | Admitting: Family Medicine

## 2019-12-24 ENCOUNTER — Other Ambulatory Visit: Payer: Self-pay | Admitting: Family Medicine

## 2019-12-24 ENCOUNTER — Other Ambulatory Visit: Payer: Self-pay

## 2019-12-24 VITALS — BP 110/64 | HR 86 | Temp 97.8°F | Resp 18 | Ht 66.0 in | Wt 218.0 lb

## 2019-12-24 DIAGNOSIS — E119 Type 2 diabetes mellitus without complications: Secondary | ICD-10-CM | POA: Diagnosis not present

## 2019-12-24 DIAGNOSIS — E7800 Pure hypercholesterolemia, unspecified: Secondary | ICD-10-CM

## 2019-12-24 NOTE — Progress Notes (Signed)
Subjective:    Patient ID: Jacob Heath, male    DOB: 1946/06/04, 74 y.o.   MRN: [id number]  HPI Patient has type 2 diabetes mellitus.  He is currently on Metformin.  He denies any polyuria polydipsia or blurry vision.  He denies any hypoglycemic episodes.  He denies any chest pain shortness of breath or dyspnea on exertion.  His blood pressure today is well controlled at 110/64.  He is not on an ACE or an ARB due to hypotension.  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  However he is due to check a urine microalbumin and if significantly elevated I would add an ACE inhibitor.  He is on a statin/atorvastatin.  He denies any myalgias or right upper quadrant pain. 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 Krista Blue   Cutaneous skin tags 01/01/2014   Diabetes mellitus type 2 with complications ([place])    Hiatal hernia    HLD (hyperlipidemia)    Hypertension 01/03/2014   Hypertrophy of prostate without urinary obstruction and other lower urinary tract symptoms (LUTS)    Internal hemorrhoids    Lipoma 01/01/2014   Lumbago 01/01/2014   OBSTRUCTIVE SLEEP APNEA 11/05/2009   npsg 1998:  AHI 23/hr, PLMS 124 with 8/hr with a/a On cpap    Osteoarthrosis, unspecified whether generalized or localized, lower leg    Unspecified arthropathy, lower leg    Wart 01/03/2014   Right 4th finger    Past Surgical History:  Procedure Laterality Date   COLONOSCOPY  07/14/2006   JOINT REPLACEMENT     bilateral knee replacement   KNEE SURGERY  08/1994 & 12/1996   x 2 Dr Noemi Chapel   NASAL SEPTUM SURGERY  2001   Prepatellar bursectomy Right 1961   ROTATOR CUFF REPAIR     right shoulder   TESTICLE REMOVAL Right 2006   TOTAL KNEE ARTHROPLASTY Bilateral LT 03/2002 & RT 02/2003   Current Outpatient Medications on File Prior to Visit  Medication Sig Dispense Refill   atorvastatin (LIPITOR)  20 MG tablet Take 1 tablet (20 mg total) by mouth daily. 90 tablet 3   UNABLE TO FIND CPAP - [facility]     No current facility-administered medications on file prior to visit.   Allergies  Allergen Reactions   Codeine     itching   Social History   Socioeconomic History   Marital status: Married    Spouse name: Not on file   Number of children: 2   Years of education: 12   Highest education level: Not on file  Occupational History   Occupation: Retired    Comment: Worked from post office  Tobacco Use   Smoking status: Former Smoker    Packs/day: 2.00    Years: 6.00    Pack years: 12.00    Types: Cigarettes    Quit date: 11/22/1964    Years since quitting: 55.1   Smokeless tobacco: Never Used  Substance and Sexual Activity   Alcohol use: Yes    Alcohol/week: 1.0 standard drinks    Types: 1 Cans of beer per week    Comment: rare   Drug use: No   Sexual activity: Not on file  Other Topics Concern   Not on file  Social History Narrative   Patient lives at home with his wife Threasa Beards)  and  His mother  Retired   [employer] school education   Right handed   Social Determinants of Health   Financial Resource Strain:    Difficulty of Paying Living Expenses: Not on file  Food Insecurity:    Worried About Charity fundraiser in the Last Year: Not on file   [employer] of Food in the Last Year: Not on file  Transportation Needs:    Lack of Transportation (Medical): Not on file   Lack of Transportation (Non-Medical): Not on file  Physical Activity:    Days of Exercise per Week: Not on file   Minutes of Exercise per Session: Not on file  Stress:    Feeling of Stress : Not on file  Social Connections:    Frequency of Communication with Friends and Family: Not on file   Frequency of Social Gatherings with Friends and Family: Not on file   Attends Religious Services: Not on file   Active Member of Clubs or Organizations: Not on file   Attends  Archivist Meetings: Not on file   Marital Status: Not on file  Intimate Partner Violence:    Fear of Current or Ex-Partner: Not on file   Emotionally Abused: Not on file   Physically Abused: Not on file   Sexually Abused: Not on file      Review of Systems     Objective:   Physical Exam Vitals reviewed.  Constitutional:      Appearance: He is obese.  Cardiovascular:     Rate and Rhythm: Normal rate and regular rhythm.     Pulses: Normal pulses.     Heart sounds: Normal heart sounds. No murmur. No friction rub. No gallop.   Pulmonary:     Effort: Pulmonary effort is normal. No respiratory distress.     Breath sounds: Normal breath sounds. No stridor. No wheezing, rhonchi or rales.  Abdominal:     General: Abdomen is flat. Bowel sounds are normal. There is no distension.     Palpations: Abdomen is soft.     Tenderness: There is no abdominal tenderness. There is no guarding or rebound.  Musculoskeletal:     Right lower leg: No edema.     Left lower leg: No edema.  Neurological:     Mental Status: He is alert.           Assessment & Plan:  Controlled type 2 diabetes mellitus without complication, without long-term current use of insulin (HCC) - Plan: Hemoglobin A1c, CBC with Differential/Platelet, COMPLETE METABOLIC PANEL WITH GFR, Lipid panel, Microalbumin, urine  Pure hypercholesterolemia  I will check a urine microalbumin and if elevated will start the patient on a low-dose ACE inhibitor.  I am hesitant to do this at the present time due to his relative hypotension.  I will check a hemoglobin A1c.  Goal hemoglobin A1c is less than 6.5.  I will also check a fasting lipid panel.  Goal LDL cholesterol is less than 100.  Monitor his liver and kidney function.  Patient's immunizations are up-to-date.  I did encourage a diabetic eye exam.  Diabetic foot exam was performed and was completely normal.

## 2019-12-25 LAB — CBC WITH DIFFERENTIAL/PLATELET
Absolute Monocytes: 525 cells/uL (ref 200–950)
Basophils Absolute: 21 cells/uL (ref 0–200)
Basophils Relative: 0.3 %
Eosinophils Absolute: 0 cells/uL — ABNORMAL LOW (ref 15–500)
Eosinophils Relative: 0 %
HCT: 38.3 % — ABNORMAL LOW (ref 38.5–50.0)
Hemoglobin: 11.8 g/dL — ABNORMAL LOW (ref 13.2–17.1)
Lymphs Abs: 2576 cells/uL (ref 850–3900)
MCH: 24.5 pg — ABNORMAL LOW (ref 27.0–33.0)
MCHC: 30.8 g/dL — ABNORMAL LOW (ref 32.0–36.0)
MCV: 79.5 fL — ABNORMAL LOW (ref 80.0–100.0)
MPV: 10.6 fL (ref 7.5–12.5)
Monocytes Relative: 7.5 %
Neutro Abs: 3878 cells/uL (ref 1500–7800)
Neutrophils Relative %: 55.4 %
Platelets: 317 10*3/uL (ref 140–400)
RBC: 4.82 10*6/uL (ref 4.20–5.80)
RDW: 14.5 % (ref 11.0–15.0)
Total Lymphocyte: 36.8 %
WBC: 7 10*3/uL (ref 3.8–10.8)

## 2019-12-25 LAB — LIPID PANEL
Cholesterol: 127 mg/dL (ref <200)
HDL: 48 mg/dL (ref >=40)
LDL Cholesterol (Calc): 62 mg/dL (calc)
Non-HDL Cholesterol (Calc): 79 mg/dL (calc) (ref <130)
Total CHOL/HDL Ratio: 2.6 (calc) (ref <5.0)
Triglycerides: 91 mg/dL (ref <150)

## 2019-12-25 LAB — TEST AUTHORIZATION

## 2019-12-25 LAB — COMPLETE METABOLIC PANEL WITH GFR
AG Ratio: 1.5 (calc) (ref 1.0–2.5)
ALT: 15 U/L (ref 9–46)
AST: 15 U/L (ref 10–35)
Albumin: 4.3 g/dL (ref 3.6–5.1)
Alkaline phosphatase (APISO): 92 U/L (ref 35–144)
BUN: 19 mg/dL (ref 7–25)
CO2: 26 mmol/L (ref 20–32)
Calcium: 9.3 mg/dL (ref 8.6–10.3)
Chloride: 107 mmol/L (ref 98–110)
Creat: 1.11 mg/dL (ref 0.70–1.18)
GFR, Est African American: 75 mL/min/{1.73_m2} (ref >=60)
GFR, Est Non African American: 65 mL/min/{1.73_m2} (ref >=60)
Globulin: 2.9 g/dL (calc) (ref 1.9–3.7)
Glucose, Bld: 119 mg/dL — ABNORMAL HIGH (ref 65–99)
Potassium: 4.9 mmol/L (ref 3.5–5.3)
Sodium: 141 mmol/L (ref 135–146)
Total Bilirubin: 0.9 mg/dL (ref 0.2–1.2)
Total Protein: 7.2 g/dL (ref 6.1–8.1)

## 2019-12-25 LAB — IRON: Iron: 47 ug/dL — ABNORMAL LOW (ref 50–180)

## 2019-12-25 LAB — HEMOGLOBIN A1C
Hgb A1c MFr Bld: 6.5 % of total Hgb — ABNORMAL HIGH (ref <5.7)
Mean Plasma Glucose: 140 (calc)
eAG (mmol/L): 7.7 (calc)

## 2019-12-25 LAB — MICROALBUMIN, URINE: Microalb, Ur: 0.3 mg/dL

## 2019-12-28 ENCOUNTER — Other Ambulatory Visit: Payer: Self-pay

## 2019-12-28 ENCOUNTER — Other Ambulatory Visit: Payer: No Typology Code available for payment source

## 2019-12-28 DIAGNOSIS — D649 Anemia, unspecified: Secondary | ICD-10-CM

## 2019-12-29 LAB — FECAL GLOBIN BY IMMUNOCHEMISTRY
FECAL GLOBIN RESULT:: NOT DETECTED
MICRO NUMBER:: 10121635
SPECIMEN QUALITY:: ADEQUATE

## 2020-02-01 ENCOUNTER — Encounter: Payer: Self-pay | Admitting: Family Medicine

## 2020-02-12 ENCOUNTER — Encounter: Payer: Self-pay | Admitting: Family Medicine

## 2020-02-12 ENCOUNTER — Other Ambulatory Visit: Payer: Self-pay

## 2020-02-12 ENCOUNTER — Ambulatory Visit (INDEPENDENT_AMBULATORY_CARE_PROVIDER_SITE_OTHER): Payer: No Typology Code available for payment source | Admitting: Family Medicine

## 2020-02-12 VITALS — BP 118/72 | HR 76 | Temp 98.1°F | Resp 18 | Ht 66.0 in | Wt 222.0 lb

## 2020-02-12 DIAGNOSIS — S39012A Strain of muscle, fascia and tendon of lower back, initial encounter: Secondary | ICD-10-CM | POA: Diagnosis not present

## 2020-02-12 MED ORDER — CYCLOBENZAPRINE HCL 10 MG PO TABS: 10.0000 mg | ORAL_TABLET | Freq: Three times a day (TID) | ORAL | 0 refills | Status: DC | PRN

## 2020-02-12 NOTE — Progress Notes (Signed)
Subjective:    Patient ID: Jacob Heath, male    DOB: [birth year], 74 y.o.   MRN: [id number]  HPI Approximately 1 week ago, the patient helped his friend unload a lawnmower onto his trailer using a ramp.  Ever since that time he has had a sharp pain in his lower back.  The left pain is located around the level of L4 slightly to the right of the lumbar spine and the paraspinal muscles.  Seizes up on him at night and he is unable to get comfortable and tosses and turns in constant pain.  However during the daytime when he is up walking around, the pain will gradually improve as though he is experiencing muscle spasms.  He denies any dysuria, he denies any urgency, he denies any frequency, he denies any hematuria.  He denies any sciatica.  He is concerned because several of his friends recently died or been diagnosed with cancer. Past Medical History:  Diagnosis Date  . Arthritis   . 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 Krista Blue  . Cutaneous skin tags 01/01/2014  . Diabetes mellitus type 2 with complications ([place])   . Hiatal hernia   . HLD (hyperlipidemia)   . Hypertension 01/03/2014  . Hypertrophy of prostate without urinary obstruction and other lower urinary tract symptoms (LUTS)   . Internal hemorrhoids   . Lipoma 01/01/2014  . Lumbago 01/01/2014  . OBSTRUCTIVE SLEEP APNEA 11/05/2009   npsg 1998:  AHI 23/hr, PLMS 124 with 8/hr with a/a On cpap   . Osteoarthrosis, unspecified whether generalized or localized, lower leg   . Unspecified arthropathy, lower leg   . Wart 01/03/2014   Right 4th finger    Past Surgical History:  Procedure Laterality Date  . COLONOSCOPY  07/14/2006  . JOINT REPLACEMENT     bilateral knee replacement  . KNEE SURGERY  08/1994 & 12/1996   x 2 Dr Noemi Chapel  . NASAL SEPTUM SURGERY  2001  . Prepatellar bursectomy Right 1961  . ROTATOR CUFF REPAIR     right shoulder  . TESTICLE REMOVAL Right 2006  . TOTAL KNEE ARTHROPLASTY Bilateral LT 03/2002 & RT 02/2003   Current Outpatient  Medications on File Prior to Visit  Medication Sig Dispense Refill  . atorvastatin (LIPITOR) 20 MG tablet Take 1 tablet (20 mg total) by mouth daily. 90 tablet 3  . metFORMIN (GLUCOPHAGE) 500 MG tablet TAKE 1 TABLET (500 MG TOTAL) BY MOUTH 2 (TWO) TIMES DAILY WITH A MEAL. 180 tablet 1  . UNABLE TO FIND CPAP - [facility]     No current facility-administered medications on file prior to visit.   Allergies  Allergen Reactions  . Codeine     itching   Social History   Socioeconomic History  . Marital status: Married    Spouse name: Not on file  . Number of children: 2  . Years of education: 45  . Highest education level: Not on file  Occupational History  . Occupation: Retired    Comment: Worked from post office  Tobacco Use  . Smoking status: Former Smoker    Packs/day: 2.00    Years: 6.00    Pack years: 12.00    Types: Cigarettes    Quit date: 11/22/1964    Years since quitting: 55.2  . Smokeless tobacco: Never Used  Substance and Sexual Activity  . Alcohol use: Yes    Alcohol/week: 1.0 standard drinks    Types: 1 Cans of beer per week  Comment: rare  . Drug use: No  . Sexual activity: Not on file  Other Topics Concern  . Not on file  Social History Narrative   Patient lives at home with his wife Threasa Beards)  and  His mother    Retired   [employer] school education   Right handed   Social Determinants of Health   Financial Resource Strain:   . Difficulty of Paying Living Expenses:   Food Insecurity:   . Worried About Charity fundraiser in the Last Year:   . Arboriculturist in the Last Year:   Transportation Needs:   . Film/video editor (Medical):   Marland Kitchen Lack of Transportation (Non-Medical):   Physical Activity:   . Days of Exercise per Week:   . Minutes of Exercise per Session:   Stress:   . Feeling of Stress :   Social Connections:   . Frequency of Communication with Friends and Family:   . Frequency of Social Gatherings with Friends and Family:     . Attends Religious Services:   . Active Member of Clubs or Organizations:   . Attends Archivist Meetings:   Marland Kitchen Marital Status:   Intimate Partner Violence:   . Fear of Current or Ex-Partner:   . Emotionally Abused:   Marland Kitchen Physically Abused:   . Sexually Abused:       Review of Systems  Musculoskeletal: Positive for back pain.       Objective:   Physical Exam Vitals reviewed.  Constitutional:      Appearance: He is obese.  Cardiovascular:     Rate and Rhythm: Normal rate and regular rhythm.     Pulses: Normal pulses.     Heart sounds: Normal heart sounds. No murmur. No friction rub. No gallop.   Pulmonary:     Effort: Pulmonary effort is normal. No respiratory distress.     Breath sounds: Normal breath sounds. No stridor. No wheezing, rhonchi or rales.  Musculoskeletal:     Lumbar back: Spasms and tenderness present. No swelling, deformity or bony tenderness. Decreased range of motion. Negative right straight leg raise test and negative left straight leg raise test.       Back:  Neurological:     Mental Status: He is alert.           Assessment & Plan:  Back strain, initial encounter - Plan: DG Lumbar Spine Complete  I believe the patient has strained a muscle in his lower back.  I recommend Flexeril 10 mg prior to bedtime.  I cautioned the patient that this medication can make him feel dizzy and sleepy and I would not recommend it during the day to avoid unintended falls.  However he can use it at night, muscle spasms that are keeping him awake.  I anticipate that the pain should gradually improve over the next 2 to 3 weeks on exam.  The patient would feel better if we did an x-ray given the recent cancer diagnoses in his cohort.  Therefore I will obtain a lumbar spine x-ray.

## 2020-02-14 ENCOUNTER — Other Ambulatory Visit: Payer: Self-pay

## 2020-02-14 ENCOUNTER — Ambulatory Visit (HOSPITAL_COMMUNITY)
Admission: RE | Admit: 2020-02-14 | Discharge: 2020-02-14 | Disposition: A | Discharge status: Home / Self Care | Payer: 59 | Source: Ambulatory Visit | Attending: Family Medicine | Admitting: Family Medicine

## 2020-02-14 DIAGNOSIS — S39012A Strain of muscle, fascia and tendon of lower back, initial encounter: Secondary | ICD-10-CM | POA: Insufficient documentation

## 2020-03-25 IMAGING — DX DG LUMBAR SPINE COMPLETE 4+V
5 series · 5 of 5 positions shown · non-contrast
Comparison: None.

CLINICAL DATA: Low back pain for 2 weeks.

EXAM:
LUMBAR SPINE - COMPLETE 4+ VIEW

[l-spine ap]
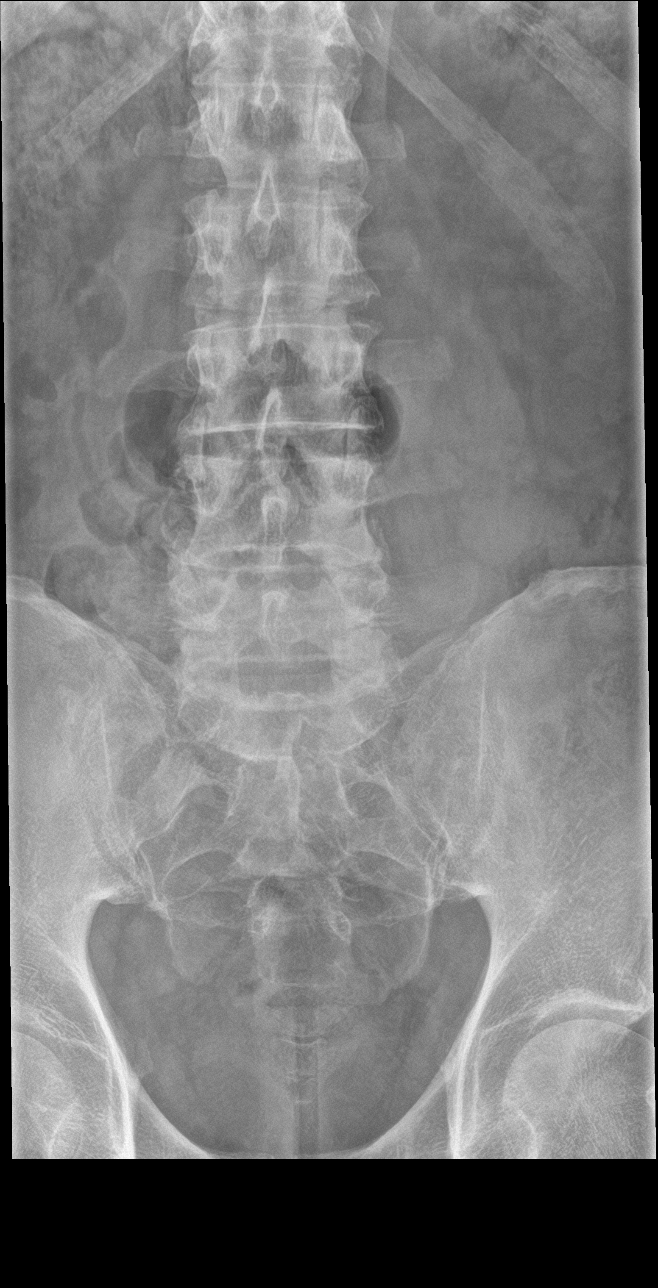

[l-spine obl (1 of 2)]
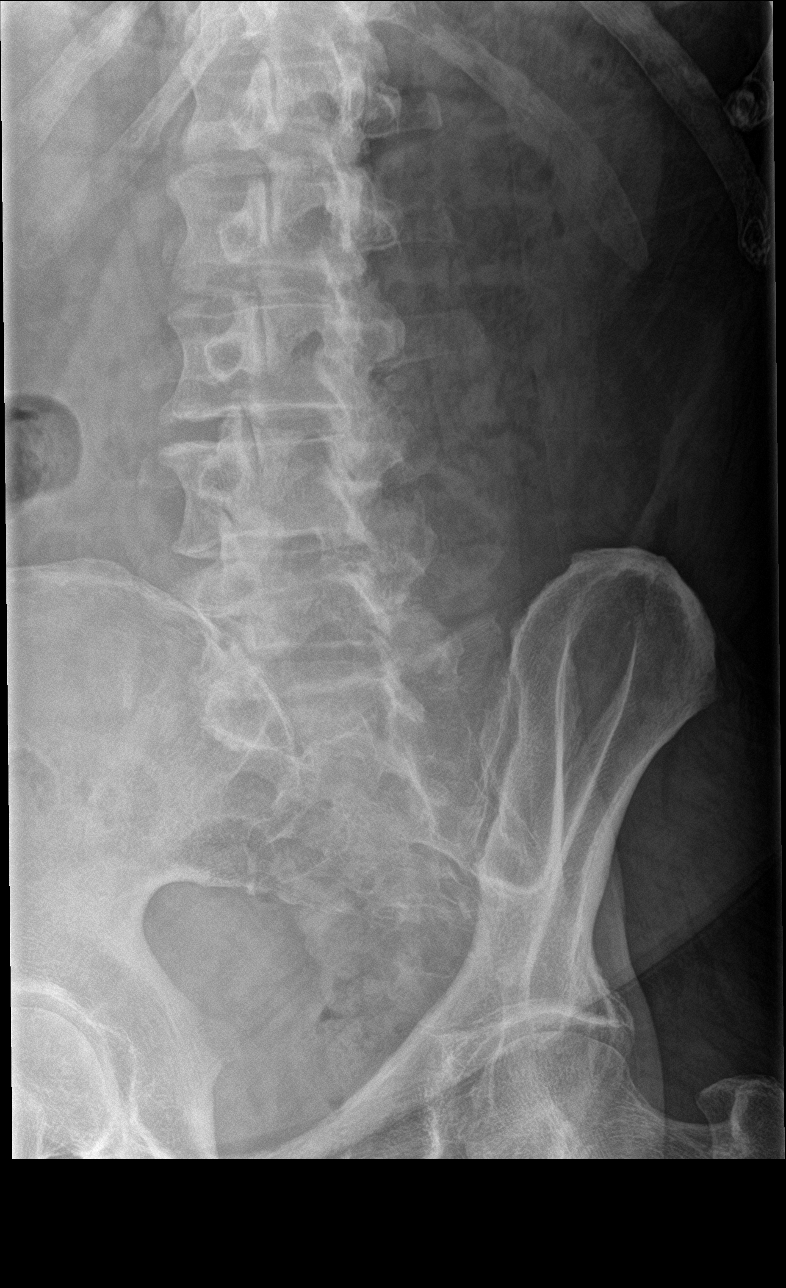

[l-spine obl (2 of 2)]
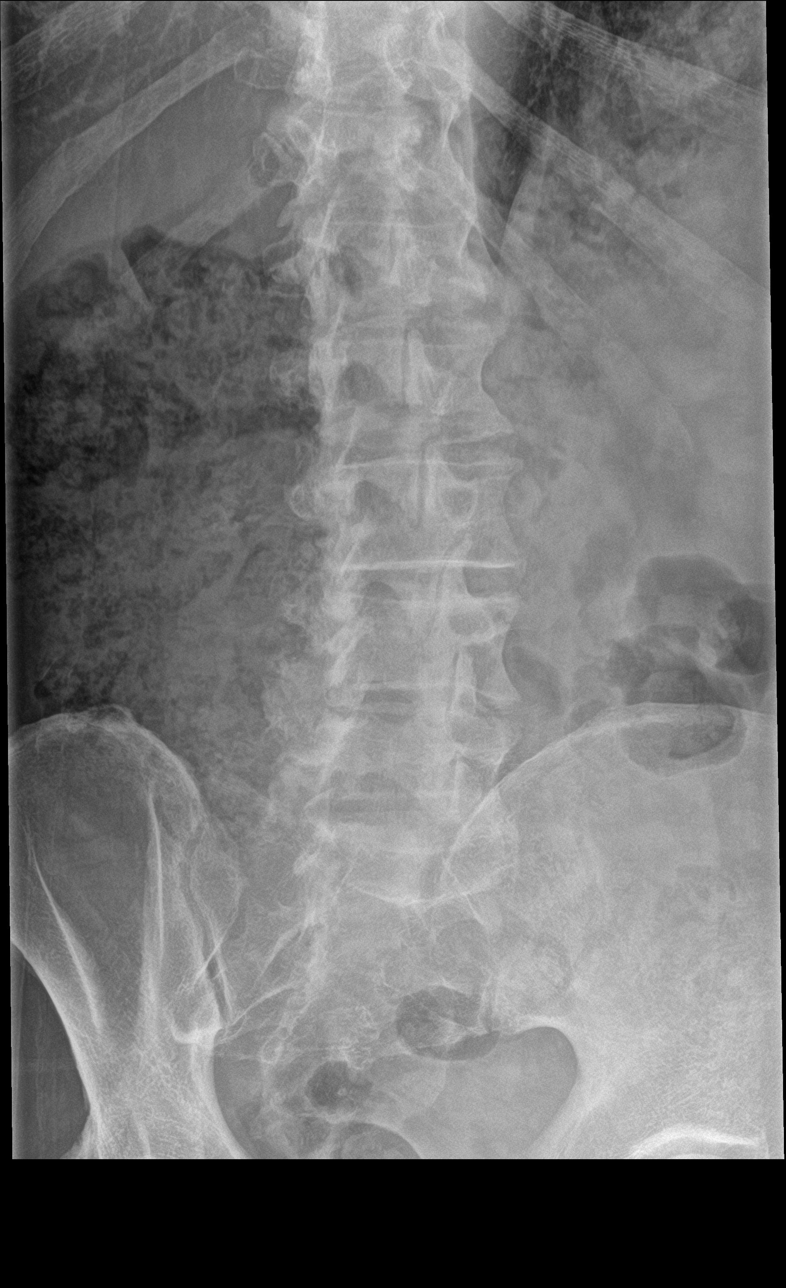

[l-spine lat]
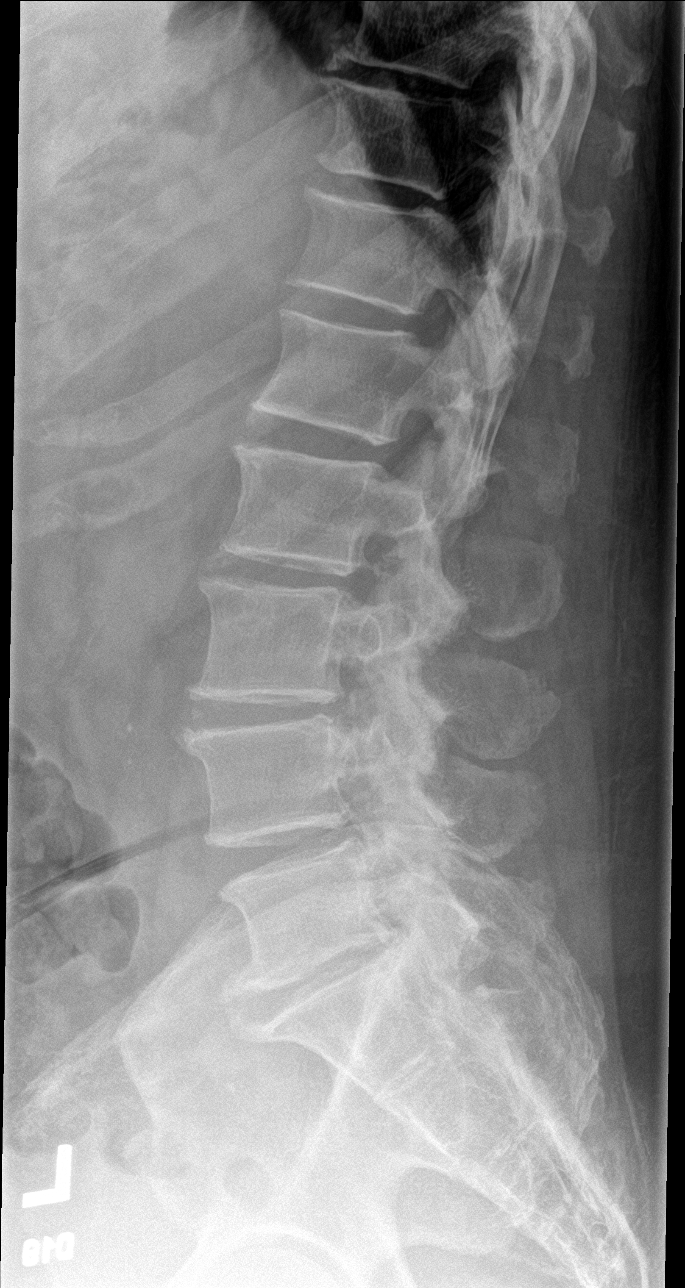

[l-spine spot]
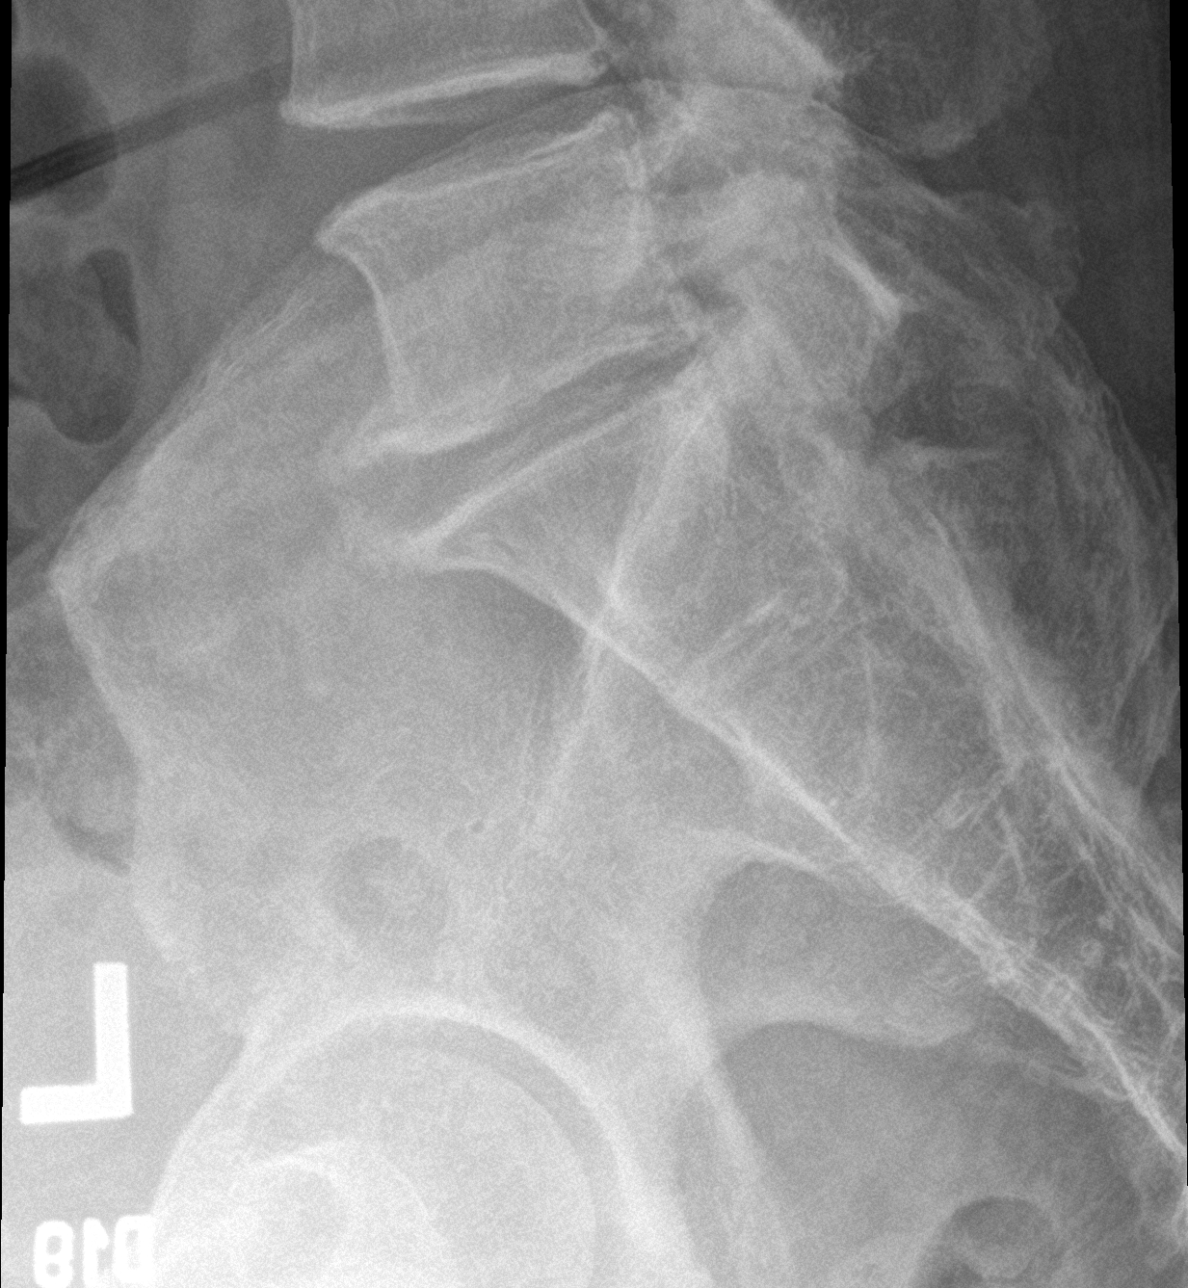

[5 of 5 positions shown; findings below may reference images not displayed]

FINDINGS: There are 5 non rib-bearing lumbar type vertebral bodies.

Normal alignment of the lumbar spine. No anterolisthesis or
retrolisthesis. No definite pars defects.

Lumbar vertebral body heights appear preserved.

Mild to moderate multilevel lumbar spine DDD, worse at L2-L3 and
L3-L4 with disc space height loss, endplate irregularity and
anteriorly directed disc osteophyte complexes at these locations.

Limited visualization of the bilateral SI joints is normal.

Large colonic stool burden without evidence of enteric obstruction.
IMPRESSION: Mild-to-moderate multilevel lumbar spine DDD.

## 2020-06-02 LAB — HM DIABETES EYE EXAM

## 2020-06-23 ENCOUNTER — Other Ambulatory Visit: Payer: Self-pay | Admitting: Family Medicine

## 2020-06-24 ENCOUNTER — Other Ambulatory Visit: Payer: Self-pay

## 2020-06-24 ENCOUNTER — Ambulatory Visit (INDEPENDENT_AMBULATORY_CARE_PROVIDER_SITE_OTHER): Payer: No Typology Code available for payment source | Admitting: Dermatology

## 2020-06-24 DIAGNOSIS — D225 Melanocytic nevi of trunk: Secondary | ICD-10-CM

## 2020-06-24 DIAGNOSIS — D229 Melanocytic nevi, unspecified: Secondary | ICD-10-CM

## 2020-06-24 DIAGNOSIS — L219 Seborrheic dermatitis, unspecified: Secondary | ICD-10-CM | POA: Diagnosis not present

## 2020-06-24 MED ORDER — HYDROCORTISONE 2.5 % EX CREA: TOPICAL_CREAM | Freq: Every day | CUTANEOUS | 2 refills | Status: DC

## 2020-08-04 ENCOUNTER — Encounter: Payer: Self-pay | Admitting: Dermatology

## 2020-08-04 NOTE — Progress Notes (Signed)
   New Patient   Subjective  Jacob Heath is a 74 y.o. male who presents for the following: Follow-up (skin check).  Skin check Location:  Duration:  Quality:  Associated Signs/Symptoms: Modifying Factors:  Severity:  Timing: Context:    The following portions of the chart were reviewed this encounter and updated as appropriate: Tobacco  Allergies  Meds  Problems  Med Hx  Surg Hx  Fam Hx      Objective  Well appearing patient in no apparent distress; mood and affect are within normal limits.  All skin waist up examined. plus legs.   Assessment & Plan  Seborrheic dermatitis Nose  2-1/2% generic Hytone cream nightly for 2 weeks, taper if improved  [place] Mid Back

## 2020-08-05 ENCOUNTER — Encounter: Payer: Self-pay | Admitting: Dermatology

## 2020-08-05 NOTE — Addendum Note (Signed)
Addended by: Lavonna Monarch on: 08/05/2020 07:01 AM   Modules accepted: Level of Service

## 2020-08-13 ENCOUNTER — Encounter: Payer: Self-pay | Admitting: Family Medicine

## 2020-08-13 DIAGNOSIS — E119 Type 2 diabetes mellitus without complications: Secondary | ICD-10-CM

## 2020-08-13 DIAGNOSIS — E7800 Pure hypercholesterolemia, unspecified: Secondary | ICD-10-CM

## 2020-08-13 DIAGNOSIS — I10 Essential (primary) hypertension: Secondary | ICD-10-CM

## 2020-08-14 MED ORDER — METFORMIN HCL 500 MG PO TABS: 500.0000 mg | ORAL_TABLET | Freq: Two times a day (BID) | ORAL | 1 refills | Status: DC

## 2020-09-29 ENCOUNTER — Ambulatory Visit (INDEPENDENT_AMBULATORY_CARE_PROVIDER_SITE_OTHER): Payer: No Typology Code available for payment source | Admitting: Dermatology

## 2020-09-29 ENCOUNTER — Telehealth: Payer: Self-pay | Admitting: Family Medicine

## 2020-09-29 ENCOUNTER — Other Ambulatory Visit: Payer: Self-pay

## 2020-09-29 DIAGNOSIS — D485 Neoplasm of uncertain behavior of skin: Secondary | ICD-10-CM

## 2020-09-29 DIAGNOSIS — Z1283 Encounter for screening for malignant neoplasm of skin: Secondary | ICD-10-CM

## 2020-09-29 DIAGNOSIS — D2239 Melanocytic nevi of other parts of face: Secondary | ICD-10-CM

## 2020-09-29 NOTE — Telephone Encounter (Signed)
Pt  recently had tenus,flu shot how soon would it be okay to get the booster shot  also would like to speak with the nurse concerning his blood sugars  # [phone]

## 2020-09-29 NOTE — Patient Instructions (Signed)

## 2020-09-29 NOTE — Telephone Encounter (Signed)
Spoke with Pt about his question's and concerns he had.

## 2020-09-30 ENCOUNTER — Other Ambulatory Visit: Payer: No Typology Code available for payment source

## 2020-09-30 DIAGNOSIS — E119 Type 2 diabetes mellitus without complications: Secondary | ICD-10-CM

## 2020-09-30 DIAGNOSIS — E7800 Pure hypercholesterolemia, unspecified: Secondary | ICD-10-CM

## 2020-09-30 DIAGNOSIS — I10 Essential (primary) hypertension: Secondary | ICD-10-CM

## 2020-10-01 LAB — CBC WITH DIFFERENTIAL/PLATELET
Absolute Monocytes: 616 cells/uL (ref 200–950)
Basophils Absolute: 31 cells/uL (ref 0–200)
Basophils Relative: 0.4 %
Eosinophils Absolute: 208 cells/uL (ref 15–500)
Eosinophils Relative: 2.7 %
HCT: 46.6 % (ref 38.5–50.0)
Hemoglobin: 14.6 g/dL (ref 13.2–17.1)
Lymphs Abs: 2849 cells/uL (ref 850–3900)
MCH: 27.6 pg (ref 27.0–33.0)
MCHC: 31.3 g/dL — ABNORMAL LOW (ref 32.0–36.0)
MCV: 88.1 fL (ref 80.0–100.0)
MPV: 10.8 fL (ref 7.5–12.5)
Monocytes Relative: 8 %
Neutro Abs: 3996 cells/uL (ref 1500–7800)
Neutrophils Relative %: 51.9 %
Platelets: 275 10*3/uL (ref 140–400)
RBC: 5.29 10*6/uL (ref 4.20–5.80)
RDW: 14.4 % (ref 11.0–15.0)
Total Lymphocyte: 37 %
WBC: 7.7 10*3/uL (ref 3.8–10.8)

## 2020-10-01 LAB — COMPLETE METABOLIC PANEL WITH GFR
AG Ratio: 1.6 (calc) (ref 1.0–2.5)
ALT: 18 U/L (ref 9–46)
AST: 16 U/L (ref 10–35)
Albumin: 4.3 g/dL (ref 3.6–5.1)
Alkaline phosphatase (APISO): 90 U/L (ref 35–144)
BUN: 21 mg/dL (ref 7–25)
CO2: 27 mmol/L (ref 20–32)
Calcium: 9.8 mg/dL (ref 8.6–10.3)
Chloride: 105 mmol/L (ref 98–110)
Creat: 1.1 mg/dL (ref 0.70–1.18)
GFR, Est African American: 76 mL/min/{1.73_m2} (ref >=60)
GFR, Est Non African American: 66 mL/min/{1.73_m2} (ref >=60)
Globulin: 2.7 g/dL (calc) (ref 1.9–3.7)
Glucose, Bld: 125 mg/dL — ABNORMAL HIGH (ref 65–99)
Potassium: 4.8 mmol/L (ref 3.5–5.3)
Sodium: 141 mmol/L (ref 135–146)
Total Bilirubin: 1 mg/dL (ref 0.2–1.2)
Total Protein: 7 g/dL (ref 6.1–8.1)

## 2020-10-01 LAB — LIPID PANEL
Cholesterol: 139 mg/dL (ref <200)
HDL: 47 mg/dL (ref >=40)
LDL Cholesterol (Calc): 71 mg/dL (calc)
Non-HDL Cholesterol (Calc): 92 mg/dL (calc) (ref <130)
Total CHOL/HDL Ratio: 3 (calc) (ref <5.0)
Triglycerides: 126 mg/dL (ref <150)

## 2020-10-01 LAB — HEMOGLOBIN A1C
Hgb A1c MFr Bld: 6.4 % of total Hgb — ABNORMAL HIGH (ref <5.7)
Mean Plasma Glucose: 137 (calc)
eAG (mmol/L): 7.6 (calc)

## 2020-10-08 ENCOUNTER — Encounter: Payer: Self-pay | Admitting: Dermatology

## 2020-10-08 ENCOUNTER — Telehealth: Payer: Self-pay | Admitting: Dermatology

## 2020-10-08 ENCOUNTER — Telehealth: Payer: Self-pay | Admitting: *Deleted

## 2020-10-08 NOTE — Telephone Encounter (Signed)
Pathology to patient.  °

## 2020-10-13 ENCOUNTER — Encounter: Payer: Self-pay | Admitting: Dermatology

## 2020-10-13 NOTE — Progress Notes (Signed)
° °  Follow-Up Visit   Subjective  Jacob Heath is a 75 y.o. male who presents for the following: Follow-up (right temple possible treatment needed?). Growth Location: Right temple Duration:  Quality: May be larger Associated Signs/Symptoms: Modifying Factors:  Severity:  Timing: Context:   Objective  Well appearing patient in no apparent distress; mood and affect are within normal limits.  A focused examination was performed including All skin waist up.. Relevant physical exam findings are noted in the Assessment and Plan.   Assessment & Plan    Neoplasm of uncertain behavior of skin Right Temple  Skin / nail biopsy Type of biopsy: tangential   Informed consent: discussed and consent obtained   Timeout: patient name, date of birth, surgical site, and procedure verified   Procedure prep:  Patient was prepped and draped in usual sterile fashion (Non sterile) Prep type:  Chlorhexidine Anesthesia: the lesion was anesthetized in a standard fashion   Anesthetic:  1% lidocaine w/ epinephrine 1-100,000 local infiltration Instrument used: flexible razor blade   Outcome: patient tolerated procedure well   Post-procedure details: sterile dressing applied and wound care instructions given   Dressing type: bandage and petrolatum    Specimen 1 - Surgical pathology Differential Diagnosis: R/O BCC vs SCC Check Margins: No  Skin exam for malignant neoplasm Mid Back  Annual skin examination.      I, Lavonna Monarch, MD, have reviewed all documentation for this visit.  The documentation on 10/13/20 for the exam, diagnosis, procedures, and orders are all accurate and complete.

## 2021-01-08 ENCOUNTER — Encounter: Payer: Self-pay | Admitting: Family Medicine

## 2021-03-12 ENCOUNTER — Other Ambulatory Visit: Payer: Self-pay

## 2021-03-12 ENCOUNTER — Encounter: Payer: Self-pay | Admitting: Family Medicine

## 2021-03-12 ENCOUNTER — Ambulatory Visit (INDEPENDENT_AMBULATORY_CARE_PROVIDER_SITE_OTHER): Payer: No Typology Code available for payment source | Admitting: Family Medicine

## 2021-03-12 VITALS — BP 134/70 | HR 76 | Temp 98.4°F | Resp 14 | Ht 66.0 in | Wt 216.0 lb

## 2021-03-12 DIAGNOSIS — I10 Essential (primary) hypertension: Secondary | ICD-10-CM | POA: Diagnosis not present

## 2021-03-12 DIAGNOSIS — Z23 Encounter for immunization: Secondary | ICD-10-CM | POA: Diagnosis not present

## 2021-03-12 DIAGNOSIS — Z1159 Encounter for screening for other viral diseases: Secondary | ICD-10-CM

## 2021-03-12 DIAGNOSIS — Z125 Encounter for screening for malignant neoplasm of prostate: Secondary | ICD-10-CM

## 2021-03-12 DIAGNOSIS — E7800 Pure hypercholesterolemia, unspecified: Secondary | ICD-10-CM | POA: Diagnosis not present

## 2021-03-12 DIAGNOSIS — Z0000 Encounter for general adult medical examination without abnormal findings: Secondary | ICD-10-CM

## 2021-03-12 DIAGNOSIS — E119 Type 2 diabetes mellitus without complications: Secondary | ICD-10-CM | POA: Diagnosis not present

## 2021-03-12 DIAGNOSIS — Z0001 Encounter for general adult medical examination with abnormal findings: Secondary | ICD-10-CM

## 2021-03-12 DIAGNOSIS — G118 Other hereditary ataxias: Secondary | ICD-10-CM

## 2021-03-12 MED ORDER — MELOXICAM 15 MG PO TABS: 15.0000 mg | ORAL_TABLET | Freq: Every day | ORAL | 0 refills | Status: DC

## 2021-03-12 NOTE — Addendum Note (Signed)
Addended by: Sheral Flow on: 03/12/2021 09:52 AM   Modules accepted: Orders

## 2021-03-12 NOTE — Telephone Encounter (Signed)
Patient in office and requesting these labs be ordered.   Please advise.

## 2021-03-12 NOTE — Addendum Note (Signed)
Addended by: Sheral Flow on: 03/12/2021 10:21 AM   Modules accepted: Orders

## 2021-03-12 NOTE — Progress Notes (Signed)
Subjective:    Patient ID: Jacob Heath, male    DOB: [birth year], 75 y.o.   MRN: [id number]  HPI Patient is a 75 year old Caucasian male here today for a CPE. 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 Patient currently sees Dr. Krista Blue.  Workup has included an MRI of the brain, MRI of the cervical, thoracic, and lumbar spine as well as numerous lab studies and nerve conduction test. 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 The only treatment option for this includes physical therapy which he is undergone numerous occasions. He has extremely poor balance. He falls numerous times every week.    Last colonoscopy was March 08, 2012.  Colonoscopy was normal and next colonoscopy was recommended in 2023.  However he would like to do fecal occult blood testing because he is concerned waiting 10 years.  He believes that that is too long and his son was recently diagnosed with pancreatic cancer.  Therefore he would like to check his stool for blood.  He is also due for a PSA.  He is due for Prevnar 20, Shingrix, and his fourth COVID shot.  He also reports aches and pains in his joints at night that keep him awake.  He is not taking any arthritis medication.  Otherwise he is doing well.  Immunization records are listed below: Immunization History  Administered Date(s) Administered  . Influenza, High Dose Seasonal PF 08/30/2017, 08/22/2018, 08/13/2019, 08/13/2019  . Influenza,inj,Quad PF,6+ Mos 01/21/2015, 07/29/2015, 07/28/2016  . Influenza-Unspecified 10/03/2013  . Moderna Sars-Covid-2 Vaccination 01/03/2020, 02/01/2020, 09/30/2020  . Pneumococcal Conjugate-13 07/29/2015  . Pneumococcal Polysaccharide-23 01/01/2014  . Td 07/11/1997  . Tdap 10/04/2007  . Zoster 04/09/2014    Past Medical History:  Diagnosis Date  . Arthritis   . 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 Krista Blue  . Cutaneous skin tags 01/01/2014  . Diabetes  mellitus type 2 with complications ([place])   . Hiatal hernia   . HLD (hyperlipidemia)   . Hypertension 01/03/2014  . Hypertrophy of prostate without urinary obstruction and other lower urinary tract symptoms (LUTS)   . Internal hemorrhoids   . Lipoma 01/01/2014  . Lumbago 01/01/2014  . OBSTRUCTIVE SLEEP APNEA 11/05/2009   npsg 1998:  AHI 23/hr, PLMS 124 with 8/hr with a/a On cpap   . Osteoarthrosis, unspecified whether generalized or localized, lower leg   . Unspecified arthropathy, lower leg   . Wart 01/03/2014   Right 4th finger    Past Surgical History:  Procedure Laterality Date  . COLONOSCOPY  07/14/2006  . JOINT REPLACEMENT     bilateral knee replacement  . KNEE SURGERY  08/1994 & 12/1996   x 2 Dr Noemi Chapel  . NASAL SEPTUM SURGERY  2001  . Prepatellar bursectomy Right 1961  . ROTATOR CUFF REPAIR     right shoulder  . TESTICLE REMOVAL Right 2006  . TOTAL KNEE ARTHROPLASTY Bilateral LT 03/2002 & RT 02/2003   Current Outpatient Medications on File Prior to Visit  Medication Sig Dispense Refill  . atorvastatin (LIPITOR) 20 MG tablet TAKE 1 TABLET BY MOUTH EVERY DAY 90 tablet 3  . cyclobenzaprine (FLEXERIL) 10 MG tablet Take 1 tablet (10 mg total) by mouth 3 (three) times daily as needed for muscle spasms. 30 tablet 0  . hydrocortisone 2.5 % cream Apply topically daily. 30 g 2  . metFORMIN (GLUCOPHAGE) 500 MG tablet Take 1 tablet (500 mg total) by mouth 2 (  two) times daily with a meal. 180 tablet 1  . UNABLE TO FIND CPAP - [facility]     No current facility-administered medications on file prior to visit.   Allergies  Allergen Reactions  . Codeine     itching   Social History   Socioeconomic History  . Marital status: Married    Spouse name: Not on file  . Number of children: 2  . Years of education: 76  . Highest education level: Not on file  Occupational History  . Occupation: Retired    Comment: Worked from post office  Tobacco Use  . Smoking status: Former  Smoker    Packs/day: 2.00    Years: 6.00    Pack years: 12.00    Types: Cigarettes    Quit date: 11/22/1964    Years since quitting: 56.3  . Smokeless tobacco: Never Used  Substance and Sexual Activity  . Alcohol use: Yes    Alcohol/week: 1.0 standard drink    Types: 1 Cans of beer per week    Comment: rare  . Drug use: No  . Sexual activity: Not on file  Other Topics Concern  . Not on file  Social History Narrative   Patient lives at home with his wife Threasa Beards)  and  His mother    Retired   [employer] school education   Right handed   Social Determinants of Health   Financial Resource Strain: Not on file  Food Insecurity: Not on file  Transportation Needs: Not on file  Physical Activity: Not on file  Stress: Not on file  Social Connections: Not on file  Intimate Partner Violence: Not on file   Family History  Problem Relation Age of Onset  . Allergies Sister   . Heart disease Mother   . Breast cancer Mother   . Heart disease Brother   . Heart disease Paternal Grandmother   . Breast cancer Paternal Grandmother   . Asthma Sister   . Stroke Maternal Grandmother   . Breast cancer Sister       Review of Systems  All other systems reviewed and are negative.      Objective:   Physical Exam Vitals reviewed.  Constitutional:      General: He is not in acute distress.    Appearance: He is well-developed. He is not diaphoretic.  HENT:     Head: Normocephalic and atraumatic.     Right Ear: External ear normal.     Left Ear: External ear normal.     Nose: Nose normal.     Mouth/Throat:     Pharynx: No oropharyngeal exudate.  Eyes:     General: No scleral icterus.       Right eye: No discharge.        Left eye: No discharge.     Conjunctiva/sclera: Conjunctivae normal.     Pupils: Pupils are equal, round, and reactive to light.  Neck:     Thyroid: No thyromegaly.     Vascular: No JVD.     Trachea: No tracheal deviation.  Cardiovascular:     Rate and Rhythm:  Normal rate and regular rhythm.     Heart sounds: Murmur heard.  No friction rub. No gallop.   Pulmonary:     Effort: Pulmonary effort is normal. No respiratory distress.     Breath sounds: Normal breath sounds. No stridor. No wheezing or rales.  Chest:     Chest wall: No tenderness.  Abdominal:  General: Bowel sounds are normal. There is no distension.     Palpations: Abdomen is soft. There is no mass.     Tenderness: There is no abdominal tenderness. There is no guarding or rebound.  Genitourinary:    Prostate: Enlarged. Not tender.     Rectum: Normal.  Musculoskeletal:     Cervical back: Normal range of motion and neck supple.     Right knee: Decreased range of motion.     Left knee: Effusion and bony tenderness present. Decreased range of motion. Tenderness present over the medial joint line.  Lymphadenopathy:     Cervical: No cervical adenopathy.  Skin:    General: Skin is warm and dry.     Coloration: Skin is not pale.     Findings: No erythema or rash.  Neurological:     Mental Status: He is alert and oriented to person, place, and time.     Cranial Nerves: No cranial nerve deficit.     Motor: No tremor, atrophy, abnormal muscle tone or seizure activity.     Coordination: Coordination abnormal.     Gait: Gait abnormal.     Deep Tendon Reflexes: Reflexes are normal and symmetric.           Assessment & Plan:  Controlled type 2 diabetes mellitus without complication, without long-term current use of insulin ([place]) - Plan: CBC with Differential/Platelet, COMPLETE METABOLIC PANEL WITH GFR, Lipid panel, Microalbumin, urine, Hemoglobin A1c  Pure hypercholesterolemia  Benign essential HTN  Spinocerebellar ataxia (HCC)  Prostate cancer screening - Plan: PSA  General medical exam - Plan: Fecal Globin By Immunochemistry  Check stool for blood.  If positive, I will refer the patient to GI.  Otherwise he is due for a colonoscopy next year.  Check PSA to screen for  prostate cancer.  Blood pressure today is acceptable.  Check CBC, CMP, lipid panel, urine microalbumin, A1c.  Goal LDL cholesterol is less than 100.  Goal A1c is less than 6.5.  Patient received Prevnar 20 today.  Recommended his fourth COVID shot as well as Shingrix.  He denies any depression or memory loss.  He does deal with falls due to his neurodegenerative condition.  We will try meloxicam 15 mg p.o. nightly for arthritic pain in his joints that keep him awake.

## 2021-03-13 LAB — COMPLETE METABOLIC PANEL WITH GFR
AG Ratio: 1.5 (calc) (ref 1.0–2.5)
ALT: 18 U/L (ref 9–46)
AST: 15 U/L (ref 10–35)
Albumin: 4.2 g/dL (ref 3.6–5.1)
Alkaline phosphatase (APISO): 92 U/L (ref 35–144)
BUN: 19 mg/dL (ref 7–25)
CO2: 24 mmol/L (ref 20–32)
Calcium: 9.5 mg/dL (ref 8.6–10.3)
Chloride: 104 mmol/L (ref 98–110)
Creat: 1.09 mg/dL (ref 0.70–1.18)
GFR, Est African American: 77 mL/min/{1.73_m2} (ref >=60)
GFR, Est Non African American: 66 mL/min/{1.73_m2} (ref >=60)
Globulin: 2.8 g/dL (calc) (ref 1.9–3.7)
Glucose, Bld: 118 mg/dL — ABNORMAL HIGH (ref 65–99)
Potassium: 4.7 mmol/L (ref 3.5–5.3)
Sodium: 139 mmol/L (ref 135–146)
Total Bilirubin: 0.7 mg/dL (ref 0.2–1.2)
Total Protein: 7 g/dL (ref 6.1–8.1)

## 2021-03-13 LAB — CBC WITH DIFFERENTIAL/PLATELET
Absolute Monocytes: 660 cells/uL (ref 200–950)
Basophils Absolute: 43 cells/uL (ref 0–200)
Basophils Relative: 0.6 %
Eosinophils Absolute: 178 cells/uL (ref 15–500)
Eosinophils Relative: 2.5 %
HCT: 45.8 % (ref 38.5–50.0)
Hemoglobin: 14.5 g/dL (ref 13.2–17.1)
Lymphs Abs: 2379 cells/uL (ref 850–3900)
MCH: 27.3 pg (ref 27.0–33.0)
MCHC: 31.7 g/dL — ABNORMAL LOW (ref 32.0–36.0)
MCV: 86.3 fL (ref 80.0–100.0)
MPV: 11.3 fL (ref 7.5–12.5)
Monocytes Relative: 9.3 %
Neutro Abs: 3841 cells/uL (ref 1500–7800)
Neutrophils Relative %: 54.1 %
Platelets: 265 10*3/uL (ref 140–400)
RBC: 5.31 10*6/uL (ref 4.20–5.80)
RDW: 14 % (ref 11.0–15.0)
Total Lymphocyte: 33.5 %
WBC: 7.1 10*3/uL (ref 3.8–10.8)

## 2021-03-13 LAB — FECAL GLOBIN BY IMMUNOCHEMISTRY
FECAL GLOBIN RESULT:: NOT DETECTED
MICRO NUMBER:: 11797705
SPECIMEN QUALITY:: ADEQUATE

## 2021-03-13 LAB — HEMOGLOBIN A1C
Hgb A1c MFr Bld: 6.5 % of total Hgb — ABNORMAL HIGH (ref <5.7)
Mean Plasma Glucose: 140 mg/dL
eAG (mmol/L): 7.7 mmol/L

## 2021-03-13 LAB — MICROALBUMIN, URINE: Microalb, Ur: <0.2 mg/dL

## 2021-03-13 LAB — LIPID PANEL
Cholesterol: 131 mg/dL (ref <200)
HDL: 47 mg/dL (ref >=40)
LDL Cholesterol (Calc): 64 mg/dL (calc)
Non-HDL Cholesterol (Calc): 84 mg/dL (calc) (ref <130)
Total CHOL/HDL Ratio: 2.8 (calc) (ref <5.0)
Triglycerides: 123 mg/dL (ref <150)

## 2021-03-13 LAB — PSA: PSA: 1.8 ng/mL (ref <=4.0)

## 2021-03-13 LAB — HEPATITIS B SURFACE ANTIBODY,QUALITATIVE: Hep B S Ab: NONREACTIVE

## 2021-03-13 LAB — HEPATITIS B SURFACE ANTIGEN: Hepatitis B Surface Ag: NONREACTIVE

## 2021-03-13 LAB — HEPATITIS B E ANTIGEN: Hep B E Ag: NONREACTIVE

## 2021-04-02 ENCOUNTER — Telehealth: Payer: Self-pay | Admitting: *Deleted

## 2021-04-02 NOTE — Telephone Encounter (Signed)
Patient son in office and voiced concern for patient medication.   States that patient is taking Mobic for arthritis pain. States that his pain is much improved and joint mobility is improved.   Reports that patient has noted increase in bruising since taking Mobic. Advised that medication is NSAID and will thin blood, leading to easy bruising.   Advised that he can try topical NSAID and sample given for Voltaren Gel.

## 2021-04-10 ENCOUNTER — Other Ambulatory Visit: Payer: Self-pay | Admitting: Family Medicine

## 2021-05-09 ENCOUNTER — Other Ambulatory Visit: Payer: Self-pay | Admitting: Family Medicine

## 2021-05-23 ENCOUNTER — Other Ambulatory Visit: Payer: Self-pay | Admitting: Family Medicine

## 2021-06-30 ENCOUNTER — Other Ambulatory Visit: Payer: Self-pay | Admitting: Family Medicine

## 2021-07-26 ENCOUNTER — Other Ambulatory Visit: Payer: Self-pay | Admitting: Family Medicine

## 2021-08-26 ENCOUNTER — Other Ambulatory Visit: Payer: Self-pay

## 2021-08-26 ENCOUNTER — Other Ambulatory Visit: Payer: No Typology Code available for payment source

## 2021-08-26 ENCOUNTER — Ambulatory Visit (INDEPENDENT_AMBULATORY_CARE_PROVIDER_SITE_OTHER): Payer: No Typology Code available for payment source | Admitting: *Deleted

## 2021-08-26 DIAGNOSIS — Z23 Encounter for immunization: Secondary | ICD-10-CM | POA: Diagnosis not present

## 2021-08-26 DIAGNOSIS — I10 Essential (primary) hypertension: Secondary | ICD-10-CM

## 2021-08-26 DIAGNOSIS — E7800 Pure hypercholesterolemia, unspecified: Secondary | ICD-10-CM

## 2021-08-26 DIAGNOSIS — Z136 Encounter for screening for cardiovascular disorders: Secondary | ICD-10-CM

## 2021-08-26 DIAGNOSIS — E119 Type 2 diabetes mellitus without complications: Secondary | ICD-10-CM

## 2021-08-27 LAB — COMPLETE METABOLIC PANEL WITH GFR
AG Ratio: 1.5 (calc) (ref 1.0–2.5)
ALT: 16 U/L (ref 9–46)
AST: 15 U/L (ref 10–35)
Albumin: 4.3 g/dL (ref 3.6–5.1)
Alkaline phosphatase (APISO): 88 U/L (ref 35–144)
BUN: 20 mg/dL (ref 7–25)
CO2: 26 mmol/L (ref 20–32)
Calcium: 9.4 mg/dL (ref 8.6–10.3)
Chloride: 107 mmol/L (ref 98–110)
Creat: 1.01 mg/dL (ref 0.70–1.28)
Globulin: 2.9 g/dL (calc) (ref 1.9–3.7)
Glucose, Bld: 111 mg/dL — ABNORMAL HIGH (ref 65–99)
Potassium: 4.7 mmol/L (ref 3.5–5.3)
Sodium: 141 mmol/L (ref 135–146)
Total Bilirubin: 1 mg/dL (ref 0.2–1.2)
Total Protein: 7.2 g/dL (ref 6.1–8.1)
eGFR: 78 mL/min/{1.73_m2} (ref >=60)

## 2021-08-27 LAB — CBC WITH DIFFERENTIAL/PLATELET
Absolute Monocytes: 599 cells/uL (ref 200–950)
Basophils Absolute: 29 cells/uL (ref 0–200)
Basophils Relative: 0.4 %
Eosinophils Absolute: 0 cells/uL — ABNORMAL LOW (ref 15–500)
Eosinophils Relative: 0 %
HCT: 45.7 % (ref 38.5–50.0)
Hemoglobin: 15 g/dL (ref 13.2–17.1)
Lymphs Abs: 2526 cells/uL (ref 850–3900)
MCH: 29.2 pg (ref 27.0–33.0)
MCHC: 32.8 g/dL (ref 32.0–36.0)
MCV: 89.1 fL (ref 80.0–100.0)
MPV: 11.1 fL (ref 7.5–12.5)
Monocytes Relative: 8.2 %
Neutro Abs: 4146 cells/uL (ref 1500–7800)
Neutrophils Relative %: 56.8 %
Platelets: 238 10*3/uL (ref 140–400)
RBC: 5.13 10*6/uL (ref 4.20–5.80)
RDW: 13.4 % (ref 11.0–15.0)
Total Lymphocyte: 34.6 %
WBC: 7.3 10*3/uL (ref 3.8–10.8)

## 2021-08-27 LAB — LIPID PANEL
Cholesterol: 133 mg/dL (ref <200)
HDL: 48 mg/dL (ref >=40)
LDL Cholesterol (Calc): 68 mg/dL (calc)
Non-HDL Cholesterol (Calc): 85 mg/dL (calc) (ref <130)
Total CHOL/HDL Ratio: 2.8 (calc) (ref <5.0)
Triglycerides: 89 mg/dL (ref <150)

## 2021-08-27 LAB — HEMOGLOBIN A1C
Hgb A1c MFr Bld: 6.1 % of total Hgb — ABNORMAL HIGH (ref <5.7)
Mean Plasma Glucose: 128 mg/dL
eAG (mmol/L): 7.1 mmol/L

## 2021-09-14 ENCOUNTER — Other Ambulatory Visit: Payer: Self-pay | Admitting: Family Medicine

## 2021-09-18 ENCOUNTER — Telehealth: Payer: Self-pay

## 2021-09-18 NOTE — Telephone Encounter (Signed)
Pt came in to drop off ppw to be filled out for handicap placard. Pt included a self addressed envelope when form is completed. Form placed in nurse's folder.  Cb# [phone]

## 2021-09-18 NOTE — Telephone Encounter (Signed)
Routed to provider

## 2021-09-25 ENCOUNTER — Other Ambulatory Visit: Payer: Self-pay | Admitting: Family Medicine

## 2021-10-11 ENCOUNTER — Other Ambulatory Visit: Payer: Self-pay | Admitting: Family Medicine

## 2021-11-13 ENCOUNTER — Telehealth: Payer: Self-pay

## 2021-11-13 NOTE — Telephone Encounter (Signed)
Pt called to report he began having cough and congestion on Wednesday. He found out several of his church member were sick this past Sunday. He tested positive for COVID yesterday. He has not been taking any OTC medications. Recommended Coricidin HBP, Delsym and Tylenol/Advil/Aleve for his symptoms. Pt states overall he is doing well. Pt advised to call us back if he is not improving or if his symptoms worsen. Pt voiced understanding. Nothing further needed at this time.

## 2021-11-14 ENCOUNTER — Other Ambulatory Visit: Payer: Self-pay | Admitting: Family Medicine

## 2021-11-27 ENCOUNTER — Other Ambulatory Visit: Payer: Self-pay

## 2021-11-27 MED ORDER — METFORMIN HCL 500 MG PO TABS: 500.0000 mg | ORAL_TABLET | Freq: Two times a day (BID) | ORAL | 1 refills | Status: DC

## 2021-12-19 ENCOUNTER — Other Ambulatory Visit: Payer: Self-pay | Admitting: Family Medicine

## 2022-02-05 ENCOUNTER — Ambulatory Visit: Payer: No Typology Code available for payment source | Admitting: Family Medicine

## 2022-02-05 ENCOUNTER — Encounter: Payer: Self-pay | Admitting: Family Medicine

## 2022-02-05 ENCOUNTER — Other Ambulatory Visit: Payer: Self-pay

## 2022-02-05 VITALS — BP 128/78 | HR 76 | Temp 97.9°F | Resp 18 | Ht 66.0 in | Wt 216.0 lb

## 2022-02-05 DIAGNOSIS — I10 Essential (primary) hypertension: Secondary | ICD-10-CM

## 2022-02-05 DIAGNOSIS — E119 Type 2 diabetes mellitus without complications: Secondary | ICD-10-CM

## 2022-02-05 DIAGNOSIS — E7800 Pure hypercholesterolemia, unspecified: Secondary | ICD-10-CM

## 2022-02-05 DIAGNOSIS — G118 Other hereditary ataxias: Secondary | ICD-10-CM | POA: Diagnosis not present

## 2022-02-05 MED ORDER — METFORMIN HCL ER 500 MG PO TB24: 1000.0000 mg | ORAL_TABLET | Freq: Every day | ORAL | 3 refills | Status: DC

## 2022-02-05 MED ORDER — PANTOPRAZOLE SODIUM 40 MG PO TBEC: 40.0000 mg | DELAYED_RELEASE_TABLET | Freq: Every day | ORAL | 1 refills | Status: DC
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
## 2022-02-06 LAB — CBC WITH DIFFERENTIAL/PLATELET
Absolute Monocytes: 697 cells/uL (ref 200–950)
Basophils Absolute: 16 cells/uL (ref 0–200)
Basophils Relative: 0.2 %
Eosinophils Absolute: 0 cells/uL — ABNORMAL LOW (ref 15–500)
Eosinophils Relative: 0 %
HCT: 47.1 % (ref 38.5–50.0)
Hemoglobin: 15.4 g/dL (ref 13.2–17.1)
Lymphs Abs: 2542 cells/uL (ref 850–3900)
MCH: 29.2 pg (ref 27.0–33.0)
MCHC: 32.7 g/dL (ref 32.0–36.0)
MCV: 89.2 fL (ref 80.0–100.0)
MPV: 10.8 fL (ref 7.5–12.5)
Monocytes Relative: 8.5 %
Neutro Abs: 4945 cells/uL (ref 1500–7800)
Neutrophils Relative %: 60.3 %
Platelets: 274 10*3/uL (ref 140–400)
RBC: 5.28 10*6/uL (ref 4.20–5.80)
RDW: 12.8 % (ref 11.0–15.0)
Total Lymphocyte: 31 %
WBC: 8.2 10*3/uL (ref 3.8–10.8)

## 2022-02-06 LAB — COMPLETE METABOLIC PANEL WITH GFR
AG Ratio: 1.4 (calc) (ref 1.0–2.5)
ALT: 17 U/L (ref 9–46)
AST: 17 U/L (ref 10–35)
Albumin: 4.4 g/dL (ref 3.6–5.1)
Alkaline phosphatase (APISO): 96 U/L (ref 35–144)
BUN: 21 mg/dL (ref 7–25)
CO2: 27 mmol/L (ref 20–32)
Calcium: 9.9 mg/dL (ref 8.6–10.3)
Chloride: 106 mmol/L (ref 98–110)
Creat: 1.19 mg/dL (ref 0.70–1.28)
Globulin: 3.2 g/dL (calc) (ref 1.9–3.7)
Glucose, Bld: 133 mg/dL — ABNORMAL HIGH (ref 65–99)
Potassium: 5.7 mmol/L — ABNORMAL HIGH (ref 3.5–5.3)
Sodium: 141 mmol/L (ref 135–146)
Total Bilirubin: 0.8 mg/dL (ref 0.2–1.2)
Total Protein: 7.6 g/dL (ref 6.1–8.1)
eGFR: 63 mL/min/{1.73_m2} (ref >=60)

## 2022-02-06 LAB — LIPID PANEL
Cholesterol: 154 mg/dL (ref <200)
HDL: 55 mg/dL (ref >=40)
LDL Cholesterol (Calc): 82 mg/dL (calc)
Non-HDL Cholesterol (Calc): 99 mg/dL (calc) (ref <130)
Total CHOL/HDL Ratio: 2.8 (calc) (ref <5.0)
Triglycerides: 90 mg/dL (ref <150)

## 2022-02-06 LAB — HEMOGLOBIN A1C
Hgb A1c MFr Bld: 6.5 % of total Hgb — ABNORMAL HIGH (ref <5.7)
Mean Plasma Glucose: 140 mg/dL
eAG (mmol/L): 7.7 mmol/L

## 2022-02-06 LAB — MICROALBUMIN, URINE: Microalb, Ur: <0.2 mg/dL

## 2022-02-10 ENCOUNTER — Other Ambulatory Visit: Payer: Self-pay

## 2022-02-10 DIAGNOSIS — E875 Hyperkalemia: Secondary | ICD-10-CM

## 2022-02-10 DIAGNOSIS — E119 Type 2 diabetes mellitus without complications: Secondary | ICD-10-CM

## 2022-02-11 ENCOUNTER — Other Ambulatory Visit: Payer: No Typology Code available for payment source

## 2022-02-11 ENCOUNTER — Other Ambulatory Visit: Payer: Self-pay

## 2022-02-11 DIAGNOSIS — E875 Hyperkalemia: Secondary | ICD-10-CM

## 2022-02-12 LAB — BASIC METABOLIC PANEL
BUN: 18 mg/dL (ref 7–25)
CO2: 28 mmol/L (ref 20–32)
Calcium: 9.8 mg/dL (ref 8.6–10.3)
Chloride: 106 mmol/L (ref 98–110)
Creat: 1.09 mg/dL (ref 0.70–1.28)
Glucose, Bld: 124 mg/dL — ABNORMAL HIGH (ref 65–99)
Potassium: 4.7 mmol/L (ref 3.5–5.3)
Sodium: 143 mmol/L (ref 135–146)

## 2022-03-01 ENCOUNTER — Other Ambulatory Visit: Payer: Self-pay | Admitting: Family Medicine

## 2022-04-10 ENCOUNTER — Other Ambulatory Visit: Payer: Self-pay | Admitting: Family Medicine

## 2022-04-12 NOTE — Telephone Encounter (Signed)
Requested medication (s) are due for refill today:   Not sure  Requested medication (s) are on the active medication list:   No  Future visit scheduled:   No   Last ordered: See pharmacy note.  Requesting Simvastatin due to cost.     Requested Prescriptions  Pending Prescriptions Disp Refills   simvastatin (ZOCOR) 40 MG tablet [Pharmacy Med Name: SIMVASTATIN 40 MG TABLET] 90 tablet 0     Cardiovascular:  Antilipid - Statins Failed - 04/10/2022 10:40 AM      Failed - Lipid Panel in normal range within the last 12 months    Cholesterol, Total  Date Value Ref Range Status  06/12/2018 179 100 - 199 mg/dL Final   Cholesterol  Date Value Ref Range Status  02/05/2022 154 <200 mg/dL Final   LDL Cholesterol (Calc)  Date Value Ref Range Status  02/05/2022 82 mg/dL (calc) Final    Comment:    Reference range: <100 . Desirable range <100 mg/dL for primary prevention;   <70 mg/dL for patients with CHD or diabetic patients  with > or = 2 CHD risk factors. Mar[facility] Kitchen LDL-C is now calculated using the Martin-Hopkins  calculation, which is a validated novel method providing  better accuracy than the Friedewald equation in the  estimation of LDL-C.  Cresenciano Genre et al. Annamaria Helling. 9242;683(41): 2061-2068  (http://education.[facility].com/faq/FAQ164)    HDL  Date Value Ref Range Status  02/05/2022 55 > OR = 40 mg/dL Final  06/12/2018 40 >39 mg/dL Final   Triglycerides  Date Value Ref Range Status  02/05/2022 90 <150 mg/dL Final         Passed - Patient is not pregnant      Passed - Valid encounter within last 12 months    Recent Outpatient Visits           2 months ago Benign essential HTN   [place] Susy Frizzle, MD   1 year ago Controlled type 2 diabetes mellitus without complication, without long-term current use of insulin ([place])   [place] Pickard, Cammie Mcgee, MD   2 years ago Back strain, initial encounter   [place]  Pickard, Cammie Mcgee, MD   2 years ago Controlled type 2 diabetes mellitus without complication, without long-term current use of insulin ([place])   [place] Pickard, Cammie Mcgee, MD   2 years ago Benign essential HTN   [place] Pickard, Cammie Mcgee, MD

## 2022-07-19 ENCOUNTER — Ambulatory Visit (HOSPITAL_COMMUNITY)
Admission: RE | Admit: 2022-07-19 | Discharge: 2022-07-19 | Disposition: A | Discharge status: Home / Self Care | Payer: No Typology Code available for payment source | Source: Ambulatory Visit | Attending: Family Medicine | Admitting: Family Medicine

## 2022-07-19 ENCOUNTER — Ambulatory Visit: Payer: No Typology Code available for payment source | Admitting: Family Medicine

## 2022-07-19 VITALS — BP 120/78 | HR 73 | Temp 98.0°F | Ht 66.0 in | Wt 208.2 lb

## 2022-07-19 DIAGNOSIS — M542 Cervicalgia: Secondary | ICD-10-CM | POA: Diagnosis present

## 2022-07-19 DIAGNOSIS — E119 Type 2 diabetes mellitus without complications: Secondary | ICD-10-CM | POA: Diagnosis not present

## 2022-07-19 DIAGNOSIS — I10 Essential (primary) hypertension: Secondary | ICD-10-CM
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
## 2022-07-20 ENCOUNTER — Telehealth: Payer: Self-pay
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
## 2022-08-09 ENCOUNTER — Ambulatory Visit: Payer: No Typology Code available for payment source | Admitting: Family Medicine

## 2022-08-09 VITALS — BP 120/56 | HR 75 | Temp 97.9°F | Ht 66.0 in | Wt 206.0 lb

## 2022-08-09 DIAGNOSIS — S76312A Strain of muscle, fascia and tendon of the posterior muscle group at thigh level, left thigh, initial encounter: Secondary | ICD-10-CM

## 2022-08-09 NOTE — Progress Notes (Signed)
Subjective:    Patient ID: Jacob Heath, male    DOB: 03/06/1946, 76 y.o.   MRN: [id number]  HPI Patient recently fell working in his yard and twisted his left knee as he fell.  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  He now has bruising extending along the medial insertion of the hamstring below the knee and upon the lateral insertion of the hamstring below the knee.  He does have some mild pain with knee flexion.  However his range of motion in his knee has actually improved.  He has always been very stiff after his knee replacement he states that he believes he is torn scar tissue because he can now fully extend his knee.  He also has good strength with knee flexion and hip extension.  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 Krista Blue   Cutaneous skin tags 01/01/2014   Diabetes mellitus type 2 with complications ([place])    Hiatal hernia    HLD (hyperlipidemia)    Hypertension 01/03/2014   Hypertrophy of prostate without urinary obstruction and other lower urinary tract symptoms (LUTS)    Internal hemorrhoids    Lipoma 01/01/2014   Lumbago 01/01/2014   OBSTRUCTIVE SLEEP APNEA 11/05/2009   npsg 1998:  AHI 23/hr, PLMS 124 with 8/hr with a/a On cpap    Osteoarthrosis, unspecified whether generalized or localized, lower leg    Unspecified arthropathy, lower leg    Wart 01/03/2014   Right 4th finger    Past Surgical History:  Procedure Laterality Date   COLONOSCOPY  07/14/2006   JOINT REPLACEMENT     bilateral knee replacement   KNEE SURGERY  08/1994 & 12/1996   x 2 Dr Noemi Chapel   NASAL SEPTUM SURGERY  2001   Prepatellar bursectomy Right [place]     right shoulder   TESTICLE REMOVAL Right 2006   TOTAL KNEE ARTHROPLASTY Bilateral LT 03/2002 & RT 02/2003   Current Outpatient Medications on File Prior to  Visit  Medication Sig Dispense Refill   atorvastatin (LIPITOR) 20 MG tablet TAKE 1 TABLET BY MOUTH EVERY DAY 90 tablet 3   metFORMIN (GLUCOPHAGE-XR) 500 MG 24 hr tablet Take 2 tablets (1,000 mg total) by mouth daily with breakfast. 180 tablet 3   pantoprazole (PROTONIX) 40 MG tablet TAKE 1 TABLET BY MOUTH EVERY DAY 30 tablet 1   UNABLE TO FIND CPAP - [facility]     No current facility-administered medications on file prior to visit.   Allergies  Allergen Reactions   Codeine     itching   Social History   Socioeconomic History   Marital status: Married    Spouse name: Not on file   Number of children: 2   Years of education: 12   Highest education level: Not on file  Occupational History   Occupation: Retired    Comment: Worked from post office  Tobacco Use   Smoking status: Former    Packs/day: 2.00    Years: 6.00    Total pack years: 12.00    Types: Cigarettes    Quit date: 11/22/1964    Years since quitting: 57.7   Smokeless tobacco: Never  Vaping Use   Vaping Use: Never used  Substance and Sexual Activity  Alcohol use: Yes    Alcohol/week: 1.0 standard drink of alcohol    Types: 1 Cans of beer per week    Comment: rare   Drug use: No   Sexual activity: Yes  Other Topics Concern   Not on file  Social History Narrative   Patient lives at home with his wife Threasa Beards)  and  His mother    Retired   [employer] school education   Right handed   Social Determinants of Health   Financial Resource Strain: Not on file  Food Insecurity: Not on file  Transportation Needs: Not on file  Physical Activity: Not on file  Stress: Not on file  Social Connections: Not on file  Intimate Partner Violence: Not on file   Family History  Problem Relation Age of Onset   Allergies Sister    Heart disease Mother    Breast cancer Mother    Heart disease Brother    Heart disease Paternal Grandmother    Breast cancer Paternal Grandmother    Asthma Sister    Stroke Maternal  Grandmother    Breast cancer Sister       Review of Systems  All other systems reviewed and are negative.      Objective:   Physical Exam Vitals reviewed.  Constitutional:      General: He is not in acute distress.    Appearance: He is well-developed. He is not diaphoretic.  HENT:     Head: Normocephalic and atraumatic.     Right Ear: External ear normal.     Left Ear: External ear normal.     Nose: Nose normal.     Mouth/Throat:     Pharynx: No oropharyngeal exudate.  Eyes:     General: No scleral icterus.       Right eye: No discharge.        Left eye: No discharge.     Conjunctiva/sclera: Conjunctivae normal.     Pupils: Pupils are equal, round, and reactive to light.  Neck:     Thyroid: No thyromegaly.     Vascular: No JVD.     Trachea: No tracheal deviation.  Cardiovascular:     Rate and Rhythm: Normal rate and regular rhythm.     Heart sounds: Murmur heard.     No friction rub. No gallop.  Pulmonary:     Effort: Pulmonary effort is normal. No respiratory distress.     Breath sounds: Normal breath sounds. No stridor. No wheezing or rales.  Chest:     Chest wall: No tenderness.  Abdominal:     General: Bowel sounds are normal. There is no distension.     Palpations: Abdomen is soft. There is no mass.     Tenderness: There is no abdominal tenderness. There is no guarding or rebound.  Genitourinary:    Prostate: Enlarged. Not tender.     Rectum: Normal.  Musculoskeletal:     Cervical back: Normal range of motion and neck supple.     Right upper leg: No tenderness or bony tenderness.     Left upper leg: Swelling present. No tenderness or bony tenderness.     Right knee: No effusion.     Left knee: Bony tenderness present. No effusion.       Legs:  Lymphadenopathy:     Cervical: No cervical adenopathy.  Skin:    General: Skin is warm and dry.     Coloration: Skin is not pale.     Findings: No  erythema or rash.  Neurological:     Mental Status: He is  alert and oriented to person, place, and time.     Cranial Nerves: No cranial nerve deficit.     Motor: No tremor, atrophy, abnormal muscle tone or seizure activity.     Coordination: Coordination abnormal.     Gait: Gait abnormal.     Deep Tendon Reflexes: Reflexes are normal and symmetric.           Assessment & Plan:  Strain of left hamstring, initial encounter Patient has partially strained his left hamstring.  However there is no evidence of a complete rupture.  I recommended range of motion stretching and limbering exercises to improve flexibility.  Healing will occur over the next 3 to 4 weeks spontaneously.  Marland Kitchen

## 2022-08-31 ENCOUNTER — Encounter: Payer: Self-pay | Admitting: Family Medicine

## 2022-08-31 ENCOUNTER — Ambulatory Visit (INDEPENDENT_AMBULATORY_CARE_PROVIDER_SITE_OTHER): Payer: No Typology Code available for payment source

## 2022-08-31 DIAGNOSIS — Z23 Encounter for immunization: Secondary | ICD-10-CM | POA: Diagnosis not present

## 2022-08-31 DIAGNOSIS — E119 Type 2 diabetes mellitus without complications: Secondary | ICD-10-CM | POA: Diagnosis not present

## 2022-09-01 ENCOUNTER — Other Ambulatory Visit: Payer: No Typology Code available for payment source

## 2022-09-01 ENCOUNTER — Other Ambulatory Visit: Payer: Self-pay | Admitting: Family Medicine

## 2022-09-01 DIAGNOSIS — E119 Type 2 diabetes mellitus without complications: Secondary | ICD-10-CM

## 2022-09-01 DIAGNOSIS — E118 Type 2 diabetes mellitus with unspecified complications: Secondary | ICD-10-CM

## 2022-09-02 LAB — MICROALBUMIN / CREATININE URINE RATIO
Creatinine, Urine: 142 mg/dL (ref 20–320)
Microalb, Ur: <0.2 mg/dL

## 2022-09-02 LAB — HOUSE ACCOUNT TRACKING

## 2022-09-03 LAB — HEMOGLOBIN A1C
Hgb A1c MFr Bld: 6.3 % of total Hgb — ABNORMAL HIGH (ref <5.7)
Mean Plasma Glucose: 134 mg/dL
eAG (mmol/L): 7.4 mmol/L

## 2022-09-03 LAB — MICROALBUMIN / CREATININE URINE RATIO

## 2022-11-08 ENCOUNTER — Ambulatory Visit: Payer: No Typology Code available for payment source | Admitting: Family Medicine

## 2022-11-08 ENCOUNTER — Encounter: Payer: Self-pay | Admitting: Family Medicine

## 2022-11-08 VITALS — BP 124/84 | HR 73 | Ht 66.0 in | Wt 210.8 lb

## 2022-11-08 DIAGNOSIS — E119 Type 2 diabetes mellitus without complications: Secondary | ICD-10-CM | POA: Diagnosis not present

## 2022-11-08 MED ORDER — MELOXICAM 15 MG PO TABS: 15.0000 mg | ORAL_TABLET | Freq: Every day | ORAL | 0 refills | Status: DC

## 2022-11-08 NOTE — Progress Notes (Signed)
Subjective:    Patient ID: Jacob Heath, male    DOB: 1946-06-03, 76 y.o.   MRN: [id number]  HPI Patient is most recent hemoglobin A1c was 6.3 in October.  He is here today to discuss treatment options.  He is overdue for checking microalbuminuria as well as checking his cholesterol.  However he is reporting worsening pain in his left knee.  He has seen his orthopedist who has performed an MRI" everything is normal with his knee replacement".  He has tried physical therapy but this is not helping.  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  Therefore he has a very "wobbly" gait which I believe could be aggravating the pain in his knee.  1 option would be a knee immobilizer to provide some stability that he can call to feel well and an anti-inflammatory would help.  He also is interested in [place].  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 Krista Blue   Cutaneous skin tags 01/01/2014   Diabetes mellitus type 2 with complications ([place])    Hiatal hernia    HLD (hyperlipidemia)    Hypertension 01/03/2014   Hypertrophy of prostate without urinary obstruction and other lower urinary tract symptoms (LUTS)    Internal hemorrhoids    Lipoma 01/01/2014   Lumbago 01/01/2014   OBSTRUCTIVE SLEEP APNEA 11/05/2009   npsg 1998:  AHI 23/hr, PLMS 124 with 8/hr with a/a On cpap    Osteoarthrosis, unspecified whether generalized or localized, lower leg    Unspecified arthropathy, lower leg    Wart 01/03/2014   Right 4th finger    Past Surgical History:  Procedure Laterality Date   COLONOSCOPY  07/14/2006   JOINT REPLACEMENT     bilateral knee replacement   KNEE SURGERY  08/1994 & 12/1996   x 2 Dr Noemi Chapel   NASAL SEPTUM SURGERY  2001   Prepatellar bursectomy Right [place]     right shoulder   TESTICLE REMOVAL Right 2006   TOTAL KNEE ARTHROPLASTY Bilateral LT 03/2002 & RT 02/2003   Current Outpatient Medications on File Prior to Visit   Medication Sig Dispense Refill   atorvastatin (LIPITOR) 20 MG tablet TAKE 1 TABLET BY MOUTH EVERY DAY 90 tablet 3   metFORMIN (GLUCOPHAGE-XR) 500 MG 24 hr tablet Take 2 tablets (1,000 mg total) by mouth daily with breakfast. 180 tablet 3   pantoprazole (PROTONIX) 40 MG tablet TAKE 1 TABLET BY MOUTH EVERY DAY 30 tablet 1   UNABLE TO FIND CPAP - [facility]     No current facility-administered medications on file prior to visit.   Allergies  Allergen Reactions   Codeine     itching   Social History   Socioeconomic History   Marital status: Married    Spouse name: Not on file   Number of children: 2   Years of education: 12   Highest education level: Not on file  Occupational History   Occupation: Retired    Comment: Worked from post office  Tobacco Use   Smoking status: Former    Packs/day: 2.00    Years: 6.00    Total pack years: 12.00    Types: Cigarettes    Quit date: 11/22/1964    Years since quitting: 58.0   Smokeless tobacco: Never  Vaping Use   Vaping Use: Never used  Substance and Sexual Activity   Alcohol use: Yes    Alcohol/week: 1.0  standard drink of alcohol    Types: 1 Cans of beer per week    Comment: rare   Drug use: No   Sexual activity: Yes  Other Topics Concern   Not on file  Social History Narrative   Patient lives at home with his wife Threasa Beards)  and  His mother    Retired   [employer] school education   Right handed   Social Determinants of Health   Financial Resource Strain: Not on file  Food Insecurity: Not on file  Transportation Needs: Not on file  Physical Activity: Not on file  Stress: Not on file  Social Connections: Not on file  Intimate Partner Violence: Not on file   Family History  Problem Relation Age of Onset   Allergies Sister    Heart disease Mother    Breast cancer Mother    Heart disease Brother    Heart disease Paternal Grandmother    Breast cancer Paternal Grandmother    Asthma Sister    Stroke Maternal  Grandmother    Breast cancer Sister       Review of Systems  All other systems reviewed and are negative.      Objective:   Physical Exam Vitals reviewed.  Constitutional:      General: He is not in acute distress.    Appearance: He is well-developed. He is not diaphoretic.  HENT:     Head: Normocephalic and atraumatic.     Right Ear: External ear normal.     Left Ear: External ear normal.     Nose: Nose normal.     Mouth/Throat:     Pharynx: No oropharyngeal exudate.  Eyes:     General: No scleral icterus.       Right eye: No discharge.        Left eye: No discharge.     Conjunctiva/sclera: Conjunctivae normal.     Pupils: Pupils are equal, round, and reactive to light.  Neck:     Vascular: No JVD.     Trachea: No tracheal deviation.  Cardiovascular:     Rate and Rhythm: Normal rate and regular rhythm.     Heart sounds: Murmur heard.     No friction rub. No gallop.  Pulmonary:     Effort: Pulmonary effort is normal. No respiratory distress.     Breath sounds: Normal breath sounds. No stridor. No wheezing or rales.  Chest:     Chest wall: No tenderness.  Abdominal:     General: Bowel sounds are normal. There is no distension.     Palpations: Abdomen is soft. There is no mass.     Tenderness: There is no abdominal tenderness. There is no guarding or rebound.  Musculoskeletal:     Left knee: Bony tenderness present. Decreased range of motion. Tenderness present over the medial joint line.  Lymphadenopathy:     Cervical: No cervical adenopathy.  Skin:    General: Skin is warm and dry.     Coloration: Skin is not pale.     Findings: No erythema or rash.  Neurological:     Mental Status: He is alert and oriented to person, place, and time.     Cranial Nerves: No cranial nerve deficit.     Motor: No tremor, atrophy, abnormal muscle tone or seizure activity.     Coordination: Coordination abnormal.     Gait: Gait abnormal.     Deep Tendon Reflexes: Reflexes are  normal and symmetric.  Assessment & Plan:  Controlled type 2 diabetes mellitus without complication, without long-term current use of insulin ([place]) - Plan: CBC with Differential/Platelet, Lipid panel, COMPLETE METABOLIC PANEL WITH GFR, Protein / Creatinine Ratio, Urine Check a urine protein to creatinine ratio.  If elevated consider switching metformin to Jardiance/Farxiga.  Check CMP as well as a fasting lipid panel.  Goal LDL cholesterol is less than 100.  If labs are excellent, consider switching metformin to Ozempic to try to facilitate weight loss to improve the pain in his left knee.  Also gave the patient meloxicam 15 mg daily to try to help with the pain in his left knee.  I believe the hinged knee brace will also help him by removing some of the "wobbliness and instability" out of the flexion and extension of his knee that occurs each time he takes a step due to his neurodegenerative ataxia.

## 2022-11-09 LAB — CBC WITH DIFFERENTIAL/PLATELET
Absolute Monocytes: 612 cells/uL (ref 200–950)
Basophils Absolute: 20 cells/uL (ref 0–200)
Basophils Relative: 0.3 %
Eosinophils Absolute: 150 cells/uL (ref 15–500)
Eosinophils Relative: 2.2 %
HCT: 44 % (ref 38.5–50.0)
Hemoglobin: 14.8 g/dL (ref 13.2–17.1)
Lymphs Abs: 2475 cells/uL (ref 850–3900)
MCH: 29.9 pg (ref 27.0–33.0)
MCHC: 33.6 g/dL (ref 32.0–36.0)
MCV: 88.9 fL (ref 80.0–100.0)
MPV: 10.8 fL (ref 7.5–12.5)
Monocytes Relative: 9 %
Neutro Abs: 3543 cells/uL (ref 1500–7800)
Neutrophils Relative %: 52.1 %
Platelets: 258 10*3/uL (ref 140–400)
RBC: 4.95 10*6/uL (ref 4.20–5.80)
RDW: 12.7 % (ref 11.0–15.0)
Total Lymphocyte: 36.4 %
WBC: 6.8 10*3/uL (ref 3.8–10.8)

## 2022-11-09 LAB — LIPID PANEL
Cholesterol: 146 mg/dL (ref <200)
HDL: 57 mg/dL (ref >=40)
LDL Cholesterol (Calc): 74 mg/dL (calc)
Non-HDL Cholesterol (Calc): 89 mg/dL (calc) (ref <130)
Total CHOL/HDL Ratio: 2.6 (calc) (ref <5.0)
Triglycerides: 74 mg/dL (ref <150)

## 2022-11-09 LAB — COMPLETE METABOLIC PANEL WITH GFR
AG Ratio: 1.6 (calc) (ref 1.0–2.5)
ALT: 13 U/L (ref 9–46)
AST: 12 U/L (ref 10–35)
Albumin: 4.4 g/dL (ref 3.6–5.1)
Alkaline phosphatase (APISO): 87 U/L (ref 35–144)
BUN: 22 mg/dL (ref 7–25)
CO2: 26 mmol/L (ref 20–32)
Calcium: 9.5 mg/dL (ref 8.6–10.3)
Chloride: 107 mmol/L (ref 98–110)
Creat: 0.98 mg/dL (ref 0.70–1.28)
Globulin: 2.7 g/dL (calc) (ref 1.9–3.7)
Glucose, Bld: 128 mg/dL — ABNORMAL HIGH (ref 65–99)
Potassium: 4.5 mmol/L (ref 3.5–5.3)
Sodium: 142 mmol/L (ref 135–146)
Total Bilirubin: 0.8 mg/dL (ref 0.2–1.2)
Total Protein: 7.1 g/dL (ref 6.1–8.1)
eGFR: 79 mL/min/{1.73_m2} (ref >=60)

## 2022-11-09 LAB — PROTEIN / CREATININE RATIO, URINE
Creatinine, Urine: 104 mg/dL (ref 20–320)
Protein/Creat Ratio: 77 mg/g creat (ref 25–148)
Protein/Creatinine Ratio: 0.077 mg/mg creat (ref 0.025–0.148)
Total Protein, Urine: 8 mg/dL (ref 5–25)

## 2022-11-29 ENCOUNTER — Telehealth: Payer: Self-pay | Admitting: Family Medicine

## 2022-11-29 NOTE — Telephone Encounter (Signed)
Prescription Request  11/29/2022  Is this a "Controlled Substance" medicine? No  LOV: 11/08/2022  What is the name of the medication or equipment?   atorvastatin (LIPITOR) 20 MG tablet   Have you contacted your pharmacy to request a refill? Yes   Which pharmacy would you like this sent to?  [id number] [place] [address] [place] [address]Phone: [phone]: [phone]   Patient notified that their request is being sent to the clinical staff for review and that they should receive a response within 2 business days.   Please advise pharmacist at [phone]

## 2022-11-30 ENCOUNTER — Other Ambulatory Visit: Payer: Self-pay

## 2022-11-30 DIAGNOSIS — E7800 Pure hypercholesterolemia, unspecified: Secondary | ICD-10-CM

## 2022-11-30 MED ORDER — ATORVASTATIN CALCIUM 20 MG PO TABS: 20.0000 mg | ORAL_TABLET | Freq: Every day | ORAL | 3 refills | Status: DC

## 2023-02-03 ENCOUNTER — Other Ambulatory Visit: Payer: Self-pay | Admitting: Family Medicine

## 2023-02-03 MED ORDER — MELOXICAM 15 MG PO TABS: 15.0000 mg | ORAL_TABLET | Freq: Every day | ORAL | 0 refills | Status: DC

## 2023-02-03 NOTE — Telephone Encounter (Signed)
Due to a glitch in the system the protocol is indicating pt needs an encounter because it's been over 12 months.    He was seen on 11/08/2022.  Requested Prescriptions  Pending Prescriptions Disp Refills   meloxicam (MOBIC) 15 MG tablet 30 tablet 0    Sig: Take 1 tablet (15 mg total) by mouth daily.     Analgesics:  COX2 Inhibitors Failed - 02/03/2023 10:25 AM      Failed - Manual Review: Labs are only required if the patient has taken medication for more than 8 weeks.      Failed - Valid encounter within last 12 months    Recent Outpatient Visits           12 months ago Benign essential HTN   [place] Pickard, Cammie Mcgee, MD   1 year ago Controlled type 2 diabetes mellitus without complication, without long-term current use of insulin ([place])   [place] [facility], Cammie Mcgee, MD   2 years ago Back strain, initial encounter   [place] Pickard, Cammie Mcgee, MD   3 years ago Controlled type 2 diabetes mellitus without complication, without long-term current use of insulin ([place])   [place] Pickard, Cammie Mcgee, MD   3 years ago Benign essential HTN   [place] Pickard, Cammie Mcgee, MD              Passed - HGB in normal range and within 360 days    Hemoglobin  Date Value Ref Range Status  11/08/2022 14.8 13.2 - 17.1 g/dL Final  06/12/2018 13.0 13.0 - 17.7 g/dL Final         Passed - Cr in normal range and within 360 days    Creat  Date Value Ref Range Status  11/08/2022 0.98 0.70 - 1.28 mg/dL Final   Creatinine, Urine  Date Value Ref Range Status  11/08/2022 104 20 - 320 mg/dL Final         Passed - HCT in normal range and within 360 days    HCT  Date Value Ref Range Status  11/08/2022 44.0 38.5 - 50.0 % Final   Hematocrit  Date Value Ref Range Status  06/12/2018 41.2 37.5 - 51.0 % Final         Passed - AST in normal range and within 360 days    AST  Date Value Ref Range Status   11/08/2022 12 10 - 35 U/L Final         Passed - ALT in normal range and within 360 days    ALT  Date Value Ref Range Status  11/08/2022 13 9 - 46 U/L Final         Passed - eGFR is 30 or above and within 360 days    GFR, Est African American  Date Value Ref Range Status  03/12/2021 77 > OR = 60 mL/min/1.65m Final   GFR, Est Non African American  Date Value Ref Range Status  03/12/2021 66 > OR = 60 mL/min/1.73mFinal   eGFR  Date Value Ref Range Status  11/08/2022 79 > OR = 60 mL/min/1.7399minal         Passed - Patient is not pregnant

## 2023-02-03 NOTE — Telephone Encounter (Signed)
Prescription Request  02/03/2023  LOV: 11/08/2022  What is the name of the medication or equipment? meloxicam (MOBIC) 15 MG tablet   Have you contacted your pharmacy to request a refill? Yes   Which pharmacy would you like this sent to?  [id number] [place] [address] [address]Phone: [phone]: [phone]   Patient notified that their request is being sent to the clinical staff for review and that they should receive a response within 2 business days.   Please [phone]

## 2023-02-08 ENCOUNTER — Encounter: Payer: Self-pay | Admitting: Family Medicine

## 2023-05-02 ENCOUNTER — Other Ambulatory Visit: Payer: Self-pay | Admitting: Family Medicine

## 2023-05-03 NOTE — Telephone Encounter (Signed)
Requested Prescriptions  Pending Prescriptions Disp Refills   metFORMIN (GLUCOPHAGE-XR) 500 MG 24 hr tablet [Pharmacy Med Name: METFORMIN HCL ER 500 MG TABLET] 180 tablet 0    Sig: TAKE 2 TABLETS BY MOUTH EVERY DAY WITH BREAKFAST     Endocrinology:  Diabetes - Biguanides Failed - 05/02/2023  2:20 AM      Failed - HBA1C is between 0 and 7.9 and within 180 days    Hgb A1c MFr Bld  Date Value Ref Range Status  09/01/2022 6.3 (H) <5.7 % of total Hgb Final    Comment:    For someone without known diabetes, a hemoglobin  A1c value between 5.7% and 6.4% is consistent with prediabetes and should be confirmed with a  follow-up test. . For someone with known diabetes, a value <7% indicates that their diabetes is well controlled. A1c targets should be individualized based on duration of diabetes, age, comorbid conditions, and other considerations. . This assay result is consistent with an increased risk of diabetes. . Currently, no consensus exists regarding use of hemoglobin A1c for diagnosis of diabetes for children. .          Failed - B12 Level in normal range and within 720 days    Vitamin B-12  Date Value Ref Range Status  07/28/2016 419 211 - 946 pg/mL Final         Failed - Valid encounter within last 6 months    Recent Outpatient Visits           1 year ago Benign essential HTN   [facility] Donita Brooks, MD   2 years ago Controlled type 2 diabetes mellitus without complication, without long-term current use of insulin (H[facility])   [facility] Pickard, Priscille Heidelberg, MD   3 years ago Back strain, initial encounter   [facility] Pickard, Priscille Heidelberg, MD   3 years ago Controlled type 2 diabetes mellitus without complication, without long-term current use of insulin (HCC)   [facility] Donita Brooks, MD   3 years ago Benign essential HTN   [facility] Pickard, Priscille Heidelberg, MD               Passed - Cr in normal range and within 360 days    Creat  Date Value Ref Range Status  11/08/2022 0.98 0.70 - 1.28 mg/dL Final   Creatinine, Urine  Date Value Ref Range Status  11/08/2022 104 20 - 320 mg/dL Final         Passed - eGFR in normal range and within 360 days    GFR, Est African American  Date Value Ref Range Status  03/12/2021 77 > OR = 60 mL/min/1.92m2 Final   GFR, Est Non African American  Date Value Ref Range Status  03/12/2021 66 > OR = 60 mL/min/1.72m2 Final   eGFR  Date Value Ref Range Status  11/08/2022 79 > OR = 60 mL/min/1.35m2 Final         Passed - CBC within normal limits and completed in the last 12 months    WBC  Date Value Ref Range Status  11/08/2022 6.8 3.8 - 10.8 Thousand/uL Final   RBC  Date Value Ref Range Status  11/08/2022 4.95 4.20 - 5.80 Million/uL Final   Hemoglobin  Date Value Ref Range Status  11/08/2022 14.8 13.2 - 17.1 g/dL Final  08/65/7846 96.2 13.0 - 17.7 g/dL Final   HCT  Date Value  Ref Range Status  11/08/2022 44.0 38.5 - 50.0 % Final   Hematocrit  Date Value Ref Range Status  06/12/2018 41.2 37.5 - 51.0 % Final   MCHC  Date Value Ref Range Status  11/08/2022 33.6 32.0 - 36.0 g/dL Final   [facility]  Date Value Ref Range Status  11/08/2022 29.9 27.0 - 33.0 pg Final   MCV  Date Value Ref Range Status  11/08/2022 88.9 80.0 - 100.0 fL Final  06/12/2018 84 79 - 97 fL Final   No results found for: "PLTCOUNTKUC", "LABPLAT", "POCPLA" RDW  Date Value Ref Range Status  11/08/2022 12.7 11.0 - 15.0 % Final  06/12/2018 15.8 (H) 12.3 - 15.4 % Final

## 2023-05-05 ENCOUNTER — Telehealth: Payer: Self-pay | Admitting: Family Medicine

## 2023-05-05 NOTE — Telephone Encounter (Signed)
Left message to return call to schedule diabetic retinal eye exam appointment.

## 2023-06-16 ENCOUNTER — Other Ambulatory Visit: Payer: Self-pay | Admitting: Family Medicine

## 2023-06-16 MED ORDER — MELOXICAM 15 MG PO TABS: 15.0000 mg | ORAL_TABLET | Freq: Every day | ORAL | 1 refills | Status: DC

## 2023-06-16 NOTE — Telephone Encounter (Signed)
Prescription Request  06/16/2023  LOV: 11/08/2022  What is the name of the medication or equipment?   meloxicam (MOBIC) 15 MG tablet   Have you contacted your pharmacy to request a refill? Yes   Which pharmacy would you like this sent to?  [id number] [place] [place] - [address] AT [facility] [address] [place] [address] Phone: [phone] Fax: [phone]    Patient notified that their request is being sent to the clinical staff for review and that they should receive a response within 2 business days.   Please advise pharmacist.

## 2023-06-16 NOTE — Telephone Encounter (Signed)
Requested Prescriptions  Pending Prescriptions Disp Refills   meloxicam (MOBIC) 15 MG tablet 90 tablet 1    Sig: Take 1 tablet (15 mg total) by mouth daily.     Analgesics:  COX2 Inhibitors Failed - 06/16/2023  9:42 AM      Failed - Manual Review: Labs are only required if the patient has taken medication for more than 8 weeks.      Failed - Valid encounter within last 12 months    Recent Outpatient Visits           1 year ago Benign essential HTN   [facility] Pickard, Priscille Heidelberg, MD   2 years ago Controlled type 2 diabetes mellitus without complication, without long-term current use of insulin (HCC)   [facility] Pickard, Priscille Heidelberg, MD   3 years ago Back strain, initial encounter   [facility] Pickard, Priscille Heidelberg, MD   3 years ago Controlled type 2 diabetes mellitus without complication, without long-term current use of insulin (HCC)   [facility] Pickard, Priscille Heidelberg, MD   3 years ago Benign essential HTN   [facility] Pickard, Priscille Heidelberg, MD              Passed - HGB in normal range and within 360 days    Hemoglobin  Date Value Ref Range Status  11/08/2022 14.8 13.2 - 17.1 g/dL Final  25/36/6440 34.7 13.0 - 17.7 g/dL Final         Passed - Cr in normal range and within 360 days    Creat  Date Value Ref Range Status  11/08/2022 0.98 0.70 - 1.28 mg/dL Final   Creatinine, Urine  Date Value Ref Range Status  11/08/2022 104 20 - 320 mg/dL Final         Passed - HCT in normal range and within 360 days    HCT  Date Value Ref Range Status  11/08/2022 44.0 38.5 - 50.0 % Final   Hematocrit  Date Value Ref Range Status  06/12/2018 41.2 37.5 - 51.0 % Final         Passed - AST in normal range and within 360 days    AST  Date Value Ref Range Status  11/08/2022 12 10 - 35 U/L Final         Passed - ALT in normal range and within 360 days    ALT  Date Value Ref Range Status  11/08/2022 13 9  - 46 U/L Final         Passed - eGFR is 30 or above and within 360 days    GFR, Est African American  Date Value Ref Range Status  03/12/2021 77 > OR = 60 mL/min/1.71m2 Final   GFR, Est Non African American  Date Value Ref Range Status  03/12/2021 66 > OR = 60 mL/min/1.26m2 Final   eGFR  Date Value Ref Range Status  11/08/2022 79 > OR = 60 mL/min/1.49m2 Final         Passed - Patient is not pregnant

## 2023-07-12 LAB — HM DIABETES EYE EXAM

## 2023-08-01 ENCOUNTER — Other Ambulatory Visit: Payer: Self-pay | Admitting: Family Medicine

## 2023-08-02 NOTE — Telephone Encounter (Signed)
Requested medication (s) are due for refill today: Yes  Requested medication (s) are on the active medication list: Yes  Last refill:  05/03/23 #180, 0RF  Future visit scheduled: No  Notes to clinic:  Unable to refill per protocol, appointment needed.    Requested Prescriptions  Pending Prescriptions Disp Refills   metFORMIN (GLUCOPHAGE-XR) 500 MG 24 hr tablet [Pharmacy Med Name: METFORMIN HCL ER 500 MG TABLET] 180 tablet 0    Sig: TAKE 2 TABLETS BY MOUTH EVERY DAY WITH BREAKFAST     Endocrinology:  Diabetes - Biguanides Failed - 08/01/2023  1:43 AM      Failed - HBA1C is between 0 and 7.9 and within 180 days    Hgb A1c MFr Bld  Date Value Ref Range Status  09/01/2022 6.3 (H) <5.7 % of total Hgb Final    Comment:    For someone without known diabetes, a hemoglobin  A1c value between 5.7% and 6.4% is consistent with prediabetes and should be confirmed with a  follow-up test. . For someone with known diabetes, a value <7% indicates that their diabetes is well controlled. A1c targets should be individualized based on duration of diabetes, age, comorbid conditions, and other considerations. . This assay result is consistent with an increased risk of diabetes. . Currently, no consensus exists regarding use of hemoglobin A1c for diagnosis of diabetes for children. .          Failed - B12 Level in normal range and within 720 days    Vitamin B-12  Date Value Ref Range Status  07/28/2016 419 211 - 946 pg/mL Final         Failed - Valid encounter within last 6 months    Recent Outpatient Visits           1 year ago Benign essential HTN   [facility] Donita Brooks, MD   2 years ago Controlled type 2 diabetes mellitus without complication, without long-term current use of insulin (HCC)   [facility] Pickard, Priscille Heidelberg, MD   3 years ago Back strain, initial encounter   [facility] Pickard, Priscille Heidelberg, MD   3 years ago  Controlled type 2 diabetes mellitus without complication, without long-term current use of insulin (HCC)   [facility] Donita Brooks, MD   3 years ago Benign essential HTN   [facility] Pickard, Priscille Heidelberg, MD              Passed - Cr in normal range and within 360 days    Creat  Date Value Ref Range Status  11/08/2022 0.98 0.70 - 1.28 mg/dL Final   Creatinine, Urine  Date Value Ref Range Status  11/08/2022 104 20 - 320 mg/dL Final         Passed - eGFR in normal range and within 360 days    GFR, Est African American  Date Value Ref Range Status  03/12/2021 77 > OR = 60 mL/min/1.24m2 Final   GFR, Est Non African American  Date Value Ref Range Status  03/12/2021 66 > OR = 60 mL/min/1.33m2 Final   eGFR  Date Value Ref Range Status  11/08/2022 79 > OR = 60 mL/min/1.55m2 Final         Passed - CBC within normal limits and completed in the last 12 months    WBC  Date Value Ref Range Status  11/08/2022 6.8 3.8 - 10.8 Thousand/uL Final  RBC  Date Value Ref Range Status  11/08/2022 4.95 4.20 - 5.80 Million/uL Final   Hemoglobin  Date Value Ref Range Status  11/08/2022 14.8 13.2 - 17.1 g/dL Final  40/98/1191 47.8 13.0 - 17.7 g/dL Final   HCT  Date Value Ref Range Status  11/08/2022 44.0 38.5 - 50.0 % Final   Hematocrit  Date Value Ref Range Status  06/12/2018 41.2 37.5 - 51.0 % Final   MCHC  Date Value Ref Range Status  11/08/2022 33.6 32.0 - 36.0 g/dL Final   [facility]  Date Value Ref Range Status  11/08/2022 29.9 27.0 - 33.0 pg Final   MCV  Date Value Ref Range Status  11/08/2022 88.9 80.0 - 100.0 fL Final  06/12/2018 84 79 - 97 fL Final   No results found for: "PLTCOUNTKUC", "LABPLAT", "POCPLA" RDW  Date Value Ref Range Status  11/08/2022 12.7 11.0 - 15.0 % Final  06/12/2018 15.8 (H) 12.3 - 15.4 % Final

## 2023-08-12 ENCOUNTER — Encounter: Payer: Self-pay | Admitting: Family Medicine

## 2023-08-12 ENCOUNTER — Ambulatory Visit: Payer: No Typology Code available for payment source | Admitting: Family Medicine

## 2023-08-12 VITALS — BP 126/82 | HR 69 | Temp 97.8°F | Ht 66.0 in | Wt 211.0 lb

## 2023-08-12 DIAGNOSIS — H903 Sensorineural hearing loss, bilateral: Secondary | ICD-10-CM | POA: Diagnosis not present

## 2023-08-12 DIAGNOSIS — Z7984 Long term (current) use of oral hypoglycemic drugs: Secondary | ICD-10-CM

## 2023-08-12 DIAGNOSIS — E119 Type 2 diabetes mellitus without complications: Secondary | ICD-10-CM

## 2023-08-12 DIAGNOSIS — I10 Essential (primary) hypertension: Secondary | ICD-10-CM

## 2023-08-12 DIAGNOSIS — G118 Other hereditary ataxias: Secondary | ICD-10-CM

## 2023-08-12 MED ORDER — SILDENAFIL CITRATE 100 MG PO TABS
50.0000 mg | ORAL_TABLET | Freq: Every day | ORAL | 11 refills | Status: DC | PRN
Start: 2023-08-12 — End: 2024-09-04

## 2023-08-12 MED ORDER — TAMSULOSIN HCL 0.4 MG PO CAPS: 0.4000 mg | ORAL_CAPSULE | Freq: Every day | ORAL | 3 refills | Status: DC

## 2023-08-12 NOTE — Progress Notes (Signed)
Subjective:    Patient ID: Jacob Heath, male    DOB: May 03, 1946, 77 y.o.   MRN: [id number]  HPI Patient is a very pleasant 77 year old Caucasian male here today for checkup.  He has a history of type 2 diabetes mellitus, hypertension, hyperlipidemia, BPH, as well as ataxia.  He does report nocturia and decreased urinary flow.  He states that he feels like he has to rush to the bathroom but when he gets to the bathroom, his urine does not flow freely and he has to wait.  This bothers him.  He also reports weakness in his left leg.  He feels like he hyperextends his left knee when he walks.  He feels like his left knee will give out underneath him.  This has been afraid to walk.  He also reports severe tinnitus and hearing loss.  This is keeping him from hearing the pastor at his church and is causing him problems in his day-to-day life.  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 Terrace Arabia   Cutaneous skin tags 01/01/2014   Diabetes mellitus type 2 with complications (HCC)    Hiatal hernia    HLD (hyperlipidemia)    Hypertension 01/03/2014   Hypertrophy of prostate without urinary obstruction and other lower urinary tract symptoms (LUTS)    Internal hemorrhoids    Lipoma 01/01/2014   Lumbago 01/01/2014   OBSTRUCTIVE SLEEP APNEA 11/05/2009   npsg 1998:  AHI 23/hr, PLMS 124 with 8/hr with a/a On cpap    Osteoarthrosis, unspecified whether generalized or localized, lower leg    Unspecified arthropathy, lower leg    Wart 01/03/2014   Right 4th finger    Past Surgical History:  Procedure Laterality Date   COLONOSCOPY  07/14/2006   JOINT REPLACEMENT     bilateral knee replacement   KNEE SURGERY  08/1994 & 12/1996   x 2 Dr Thurston Hole   NASAL SEPTUM SURGERY  2001   Prepatellar bursectomy Right 1961   ROTATOR CUFF REPAIR     right shoulder   TESTICLE REMOVAL Right 2006   TOTAL KNEE ARTHROPLASTY Bilateral LT 03/2002 & RT 02/2003   Current Outpatient Medications on  File Prior to Visit  Medication Sig Dispense Refill   atorvastatin (LIPITOR) 20 MG tablet Take 1 tablet (20 mg total) by mouth daily. 90 tablet 3   meloxicam (MOBIC) 15 MG tablet Take 1 tablet (15 mg total) by mouth daily. 90 tablet 1   metFORMIN (GLUCOPHAGE-XR) 500 MG 24 hr tablet TAKE 2 TABLETS BY MOUTH EVERY DAY WITH BREAKFAST 180 tablet 0   UNABLE TO FIND CPAP - [facility]     pantoprazole (PROTONIX) 40 MG tablet TAKE 1 TABLET BY MOUTH EVERY DAY (Patient not taking: Reported on 08/12/2023) 30 tablet 1   No current facility-administered medications on file prior to visit.   Allergies  Allergen Reactions   Codeine     itching   Social History   Socioeconomic History   Marital status: Married    Spouse name: Not on file   Number of children: 2   Years of education: 12   Highest education level: Not on file  Occupational History   Occupation: Retired    Comment: Worked from post office  Tobacco Use   Smoking status: Former    Current packs/day: 0.00    Average packs/day: 2.0 packs/day for 6.0 years (12.0 ttl pk-yrs)    Types: Cigarettes  Start date: 11/22/1958    Quit date: 11/22/1964    Years since quitting: 58.7   Smokeless tobacco: Never  Vaping Use   Vaping status: Never Used  Substance and Sexual Activity   Alcohol use: Yes    Alcohol/week: 1.0 standard drink of alcohol    Types: 1 Cans of beer per week    Comment: rare   Drug use: No   Sexual activity: Yes  Other Topics Concern   Not on file  Social History Narrative   Patient lives at home with his wife Shawna Orleans)  and  His mother    Retired   [employer] school education   Right handed   Social Determinants of Health   Financial Resource Strain: Not on file  Food Insecurity: Not on file  Transportation Needs: Not on file  Physical Activity: Not on file  Stress: Not on file  Social Connections: Not on file  Intimate Partner Violence: Not on file   Family History  Problem Relation Age of Onset    Allergies Sister    Heart disease Mother    Breast cancer Mother    Heart disease Brother    Heart disease Paternal Grandmother    Breast cancer Paternal Grandmother    Asthma Sister    Stroke Maternal Grandmother    Breast cancer Sister       Review of Systems  All other systems reviewed and are negative.      Objective:   Physical Exam Vitals reviewed.  Constitutional:      General: He is not in acute distress.    Appearance: He is well-developed. He is not diaphoretic.  HENT:     Head: Normocephalic and atraumatic.     Right Ear: External ear normal.     Left Ear: External ear normal.     Nose: Nose normal.     Mouth/Throat:     Pharynx: No oropharyngeal exudate.  Eyes:     General: No scleral icterus.       Right eye: No discharge.        Left eye: No discharge.     Conjunctiva/sclera: Conjunctivae normal.     Pupils: Pupils are equal, round, and reactive to light.  Neck:     Vascular: No JVD.     Trachea: No tracheal deviation.  Cardiovascular:     Rate and Rhythm: Normal rate and regular rhythm.     Heart sounds: Murmur heard.     No friction rub. No gallop.  Pulmonary:     Effort: Pulmonary effort is normal. No respiratory distress.     Breath sounds: Normal breath sounds. No stridor. No wheezing or rales.  Chest:     Chest wall: No tenderness.  Abdominal:     General: Bowel sounds are normal. There is no distension.     Palpations: Abdomen is soft. There is no mass.     Tenderness: There is no abdominal tenderness. There is no guarding or rebound.  Musculoskeletal:     Left knee: Bony tenderness present. Decreased range of motion. Tenderness present over the medial joint line.  Lymphadenopathy:     Cervical: No cervical adenopathy.  Skin:    General: Skin is warm and dry.     Coloration: Skin is not pale.     Findings: No erythema or rash.  Neurological:     Mental Status: He is alert and oriented to person, place, and time.     Cranial Nerves:  No cranial  nerve deficit.     Motor: No tremor, atrophy, abnormal muscle tone or seizure activity.     Coordination: Coordination abnormal.     Gait: Gait abnormal.     Deep Tendon Reflexes: Reflexes are normal and symmetric.           Assessment & Plan:  Controlled type 2 diabetes mellitus without complication, without long-term current use of insulin (HCC) - Plan: COMPLETE METABOLIC PANEL WITH GFR, Lipid panel, Hemoglobin A1c, CBC with Differential/Platelet, Protein / Creatinine Ratio, Urine  Sensorineural hearing loss (SNHL) of both ears - Plan: Ambulatory referral to Audiology  Benign essential HTN  Spinocerebellar ataxia (HCC) First I recommended hinged knee brace to help remove some of the instability in his knee.  I believe although instability is likely due to his spinocerebellar ataxia.  Hopefully this will help prevent falls and future knee injuries.  Second I recommended a referral to an audiologist for evaluation for hearing aids.  Third I recommended starting Flomax 0.4 mg p.o. nightly for BPH with lower urinary tract symptoms.  Fourth his blood pressure is well-controlled.  For his diabetes I will check a hemoglobin A1c, fasting lipid panel, and urine protein to creatinine ratio.  Goal LDL cholesterol is less than 100 and goal hemoglobin A1c is less than 6.5

## 2023-08-13 LAB — PROTEIN / CREATININE RATIO, URINE
Creatinine, Urine: 122 mg/dL (ref 20–320)
Protein/Creat Ratio: 90 mg/g creat (ref 25–148)
Protein/Creatinine Ratio: 0.09 mg/mg creat (ref 0.025–0.148)
Total Protein, Urine: 11 mg/dL (ref 5–25)

## 2023-08-13 LAB — CBC WITH DIFFERENTIAL/PLATELET
Absolute Monocytes: 482 cells/uL (ref 200–950)
Basophils Absolute: 18 cells/uL (ref 0–200)
Basophils Relative: 0.3 %
Eosinophils Absolute: 153 cells/uL (ref 15–500)
Eosinophils Relative: 2.5 %
HCT: 46.7 % (ref 38.5–50.0)
Hemoglobin: 15 g/dL (ref 13.2–17.1)
Lymphs Abs: 2227 cells/uL (ref 850–3900)
MCH: 29.8 pg (ref 27.0–33.0)
MCHC: 32.1 g/dL (ref 32.0–36.0)
MCV: 92.8 fL (ref 80.0–100.0)
MPV: 11.1 fL (ref 7.5–12.5)
Monocytes Relative: 7.9 %
Neutro Abs: 3221 cells/uL (ref 1500–7800)
Neutrophils Relative %: 52.8 %
Platelets: 240 10*3/uL (ref 140–400)
RBC: 5.03 10*6/uL (ref 4.20–5.80)
RDW: 12.8 % (ref 11.0–15.0)
Total Lymphocyte: 36.5 %
WBC: 6.1 10*3/uL (ref 3.8–10.8)

## 2023-08-13 LAB — COMPLETE METABOLIC PANEL WITH GFR
AG Ratio: 1.7 (calc) (ref 1.0–2.5)
ALT: 14 U/L (ref 9–46)
AST: 13 U/L (ref 10–35)
Albumin: 4.5 g/dL (ref 3.6–5.1)
Alkaline phosphatase (APISO): 86 U/L (ref 35–144)
BUN: 20 mg/dL (ref 7–25)
CO2: 29 mmol/L (ref 20–32)
Calcium: 9.5 mg/dL (ref 8.6–10.3)
Chloride: 105 mmol/L (ref 98–110)
Creat: 1.04 mg/dL (ref 0.70–1.28)
Globulin: 2.6 g/dL (calc) (ref 1.9–3.7)
Glucose, Bld: 108 mg/dL — ABNORMAL HIGH (ref 65–99)
Potassium: 4.7 mmol/L (ref 3.5–5.3)
Sodium: 142 mmol/L (ref 135–146)
Total Bilirubin: 1 mg/dL (ref 0.2–1.2)
Total Protein: 7.1 g/dL (ref 6.1–8.1)
eGFR: 74 mL/min/{1.73_m2} (ref >=60)

## 2023-08-13 LAB — LIPID PANEL
Cholesterol: 135 mg/dL (ref <200)
HDL: 53 mg/dL (ref >=40)
LDL Cholesterol (Calc): 65 mg/dL (calc)
Non-HDL Cholesterol (Calc): 82 mg/dL (calc) (ref <130)
Total CHOL/HDL Ratio: 2.5 (calc) (ref <5.0)
Triglycerides: 90 mg/dL (ref <150)

## 2023-08-13 LAB — HEMOGLOBIN A1C
Hgb A1c MFr Bld: 6.3 % of total Hgb — ABNORMAL HIGH (ref <5.7)
Mean Plasma Glucose: 134 mg/dL
eAG (mmol/L): 7.4 mmol/L

## 2023-08-25 ENCOUNTER — Ambulatory Visit: Payer: No Typology Code available for payment source | Admitting: Family Medicine

## 2023-08-25 ENCOUNTER — Encounter: Payer: Self-pay | Admitting: Family Medicine

## 2023-08-25 VITALS — BP 130/82 | HR 73 | Temp 98.0°F | Ht 66.0 in | Wt 211.0 lb

## 2023-08-25 DIAGNOSIS — J329 Chronic sinusitis, unspecified: Secondary | ICD-10-CM

## 2023-08-25 MED ORDER — AMOXICILLIN 875 MG PO TABS: 875.0000 mg | ORAL_TABLET | Freq: Two times a day (BID) | ORAL | 0 refills | Status: AC

## 2023-08-25 MED ORDER — FLUTICASONE PROPIONATE 50 MCG/ACT NA SUSP: 2.0000 | Freq: Every day | NASAL | 6 refills | Status: DC

## 2023-08-25 NOTE — Progress Notes (Signed)
Subjective:    Patient ID: Jacob Heath, male    DOB: Sep 19, 1946, 77 y.o.   MRN: [id number]  HPI 1 week ago, the patient developed a head cold.  Symptoms include a cough, postnasal drip, sore scratchy throat, head congestion.  He now has thick sinus drainage accumulating in the back of his throat.  He states that at times he is unable to cough it up.  He can feel it draining down his throat.  He can be thick and yellow or even blood-tinged.  He also reports pressure in both maxillary sinuses and a dull frontal sinus headache..  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 Terrace Arabia   Cutaneous skin tags 01/01/2014   Diabetes mellitus type 2 with complications (HCC)    Hiatal hernia    HLD (hyperlipidemia)    Hypertension 01/03/2014   Hypertrophy of prostate without urinary obstruction and other lower urinary tract symptoms (LUTS)    Internal hemorrhoids    Lipoma 01/01/2014   Lumbago 01/01/2014   OBSTRUCTIVE SLEEP APNEA 11/05/2009   npsg 1998:  AHI 23/hr, PLMS 124 with 8/hr with a/a On cpap    Osteoarthrosis, unspecified whether generalized or localized, lower leg    Unspecified arthropathy, lower leg    Wart 01/03/2014   Right 4th finger    Past Surgical History:  Procedure Laterality Date   COLONOSCOPY  07/14/2006   JOINT REPLACEMENT     bilateral knee replacement   KNEE SURGERY  08/1994 & 12/1996   x 2 Dr Thurston Hole   NASAL SEPTUM SURGERY  2001   Prepatellar bursectomy Right 1961   ROTATOR CUFF REPAIR     right shoulder   TESTICLE REMOVAL Right 2006   TOTAL KNEE ARTHROPLASTY Bilateral LT 03/2002 & RT 02/2003   Current Outpatient Medications on File Prior to Visit  Medication Sig Dispense Refill   atorvastatin (LIPITOR) 20 MG tablet Take 1 tablet (20 mg total) by mouth daily. 90 tablet 3   meloxicam (MOBIC) 15 MG tablet Take 1 tablet (15 mg total) by mouth daily. 90 tablet 1   metFORMIN (GLUCOPHAGE-XR) 500 MG 24 hr tablet TAKE 2 TABLETS BY MOUTH  EVERY DAY WITH BREAKFAST 180 tablet 1   sildenafil (VIAGRA) 100 MG tablet Take 0.5-1 tablets (50-100 mg total) by mouth daily as needed for erectile dysfunction. 5 tablet 11   tamsulosin (FLOMAX) 0.4 MG CAPS capsule Take 1 capsule (0.4 mg total) by mouth daily. 30 capsule 3   UNABLE TO FIND CPAP - [facility]     pantoprazole (PROTONIX) 40 MG tablet TAKE 1 TABLET BY MOUTH EVERY DAY (Patient not taking: Reported on 08/25/2023) 30 tablet 1   No current facility-administered medications on file prior to visit.   Allergies  Allergen Reactions   Codeine     itching   Social History   Socioeconomic History   Marital status: Married    Spouse name: Not on file   Number of children: 2   Years of education: 12   Highest education level: Not on file  Occupational History   Occupation: Retired    Comment: Worked from post office  Tobacco Use   Smoking status: Former    Current packs/day: 0.00    Average packs/day: 2.0 packs/day for 6.0 years (12.0 ttl pk-yrs)    Types: Cigarettes    Start date: 11/22/1958    Quit date: 11/22/1964    Years since quitting: 90.7  Smokeless tobacco: Never  Vaping Use   Vaping status: Never Used  Substance and Sexual Activity   Alcohol use: Yes    Alcohol/week: 1.0 standard drink of alcohol    Types: 1 Cans of beer per week    Comment: rare   Drug use: No   Sexual activity: Yes  Other Topics Concern   Not on file  Social History Narrative   Patient lives at home with his wife Shawna Orleans)  and  His mother    Retired   [employer] school education   Right handed   Social Determinants of Health   Financial Resource Strain: Not on file  Food Insecurity: Not on file  Transportation Needs: Not on file  Physical Activity: Not on file  Stress: Not on file  Social Connections: Not on file  Intimate Partner Violence: Not on file   Family History  Problem Relation Age of Onset   Allergies Sister    Heart disease Mother    Breast cancer Mother     Heart disease Brother    Heart disease Paternal Grandmother    Breast cancer Paternal Grandmother    Asthma Sister    Stroke Maternal Grandmother    Breast cancer Sister       Review of Systems  All other systems reviewed and are negative.      Objective:   Physical Exam Vitals reviewed.  Constitutional:      General: He is not in acute distress.    Appearance: He is well-developed. He is not diaphoretic.  HENT:     Head: Normocephalic and atraumatic.     Right Ear: Tympanic membrane, ear canal and external ear normal.     Left Ear: Tympanic membrane, ear canal and external ear normal.     Nose: Congestion and rhinorrhea present.     Right Turbinates: Not swollen or pale.     Left Turbinates: Not swollen or pale.     Right Sinus: Maxillary sinus tenderness present.     Left Sinus: Maxillary sinus tenderness present.     Mouth/Throat:     Pharynx: No oropharyngeal exudate.  Eyes:     General: No scleral icterus.       Right eye: No discharge.        Left eye: No discharge.     Conjunctiva/sclera: Conjunctivae normal.     Pupils: Pupils are equal, round, and reactive to light.  Neck:     Vascular: No JVD.     Trachea: No tracheal deviation.  Cardiovascular:     Rate and Rhythm: Normal rate and regular rhythm.     Heart sounds: Murmur heard.     No friction rub. No gallop.  Pulmonary:     Effort: Pulmonary effort is normal. No respiratory distress.     Breath sounds: Normal breath sounds. No stridor. No wheezing or rales.  Chest:     Chest wall: No tenderness.  Abdominal:     General: Bowel sounds are normal. There is no distension.     Palpations: Abdomen is soft. There is no mass.     Tenderness: There is no abdominal tenderness. There is no guarding or rebound.  Musculoskeletal:     Left knee: Bony tenderness present. Decreased range of motion. Tenderness present over the medial joint line.  Lymphadenopathy:     Cervical: No cervical adenopathy.  Skin:     General: Skin is warm and dry.     Coloration: Skin is not pale.  Findings: No erythema or rash.  Neurological:     Mental Status: He is alert and oriented to person, place, and time.     Cranial Nerves: No cranial nerve deficit.     Motor: No tremor, atrophy, abnormal muscle tone or seizure activity.     Coordination: Coordination abnormal.     Gait: Gait abnormal.     Deep Tendon Reflexes: Reflexes are normal and symmetric.           Assessment & Plan:  Rhinosinusitis I believe the patient has a secondary sinus infection.  Start Flonase 2 sprays each nostril daily.  If no better in 5 days, add amoxicillin 875 mg twice daily for 10 days.

## 2023-09-12 ENCOUNTER — Telehealth: Payer: Self-pay

## 2023-09-12 NOTE — Telephone Encounter (Signed)
Pt called in wanting to ask pcp/nurse if pt needs to get any other vaccination shots like RVS? Pt stated that he received his flu shot at his pharmacy and pharmacist suggested that he get an RSV shot. Pt wanted to speak with pcp/nurse before doing so. Please advise.  Cb#: [phone]

## 2023-09-21 ENCOUNTER — Other Ambulatory Visit: Payer: Self-pay | Admitting: Family Medicine

## 2023-09-22 NOTE — Telephone Encounter (Signed)
Transmission to pharmacy failed. Requested Prescriptions  Pending Prescriptions Disp Refills   metFORMIN (GLUCOPHAGE-XR) 500 MG 24 hr tablet [Pharmacy Med Name: METFORMIN HCL ER 500 MG TABLET] 180 tablet 1    Sig: TAKE 2 TABLETS BY MOUTH EVERY DAY WITH BREAKFAST     Endocrinology:  Diabetes - Biguanides Failed - 09/21/2023  1:01 PM      Failed - B12 Level in normal range and within 720 days    Vitamin B-12  Date Value Ref Range Status  07/28/2016 419 211 - 946 pg/mL Final         Failed - Valid encounter within last 6 months    Recent Outpatient Visits           1 year ago Benign essential HTN   [facility] Pickard, Priscille Heidelberg, MD   2 years ago Controlled type 2 diabetes mellitus without complication, without long-term current use of insulin (HCC)   [facility] Pickard, Priscille Heidelberg, MD   3 years ago Back strain, initial encounter   [facility] Pickard, Priscille Heidelberg, MD   3 years ago Controlled type 2 diabetes mellitus without complication, without long-term current use of insulin (HCC)   [facility] Pickard, Priscille Heidelberg, MD   4 years ago Benign essential HTN   [facility] Pickard, Priscille Heidelberg, MD              Passed - Cr in normal range and within 360 days    Creat  Date Value Ref Range Status  08/12/2023 1.04 0.70 - 1.28 mg/dL Final   Creatinine, Urine  Date Value Ref Range Status  08/12/2023 122 20 - 320 mg/dL Final         Passed - HBA1C is between 0 and 7.9 and within 180 days    Hgb A1c MFr Bld  Date Value Ref Range Status  08/12/2023 6.3 (H) <5.7 % of total Hgb Final    Comment:    For someone without known diabetes, a hemoglobin  A1c value between 5.7% and 6.4% is consistent with prediabetes and should be confirmed with a  follow-up test. . For someone with known diabetes, a value <7% indicates that their diabetes is well controlled. A1c targets should be individualized based on  duration of diabetes, age, comorbid conditions, and other considerations. . This assay result is consistent with an increased risk of diabetes. . Currently, no consensus exists regarding use of hemoglobin A1c for diagnosis of diabetes for children. .          Passed - eGFR in normal range and within 360 days    GFR, Est African American  Date Value Ref Range Status  03/12/2021 77 > OR = 60 mL/min/1.48m2 Final   GFR, Est Non African American  Date Value Ref Range Status  03/12/2021 66 > OR = 60 mL/min/1.62m2 Final   eGFR  Date Value Ref Range Status  08/12/2023 74 > OR = 60 mL/min/1.48m2 Final         Passed - CBC within normal limits and completed in the last 12 months    WBC  Date Value Ref Range Status  08/12/2023 6.1 3.8 - 10.8 Thousand/uL Final   RBC  Date Value Ref Range Status  08/12/2023 5.03 4.20 - 5.80 Million/uL Final   Hemoglobin  Date Value Ref Range Status  08/12/2023 15.0 13.2 - 17.1 g/dL Final  40/98/1191 47.8 13.0 - 17.7 g/dL Final  HCT  Date Value Ref Range Status  08/12/2023 46.7 38.5 - 50.0 % Final   Hematocrit  Date Value Ref Range Status  06/12/2018 41.2 37.5 - 51.0 % Final   MCHC  Date Value Ref Range Status  08/12/2023 32.1 32.0 - 36.0 g/dL Final   [facility]  Date Value Ref Range Status  08/12/2023 29.8 27.0 - 33.0 pg Final   MCV  Date Value Ref Range Status  08/12/2023 92.8 80.0 - 100.0 fL Final  06/12/2018 84 79 - 97 fL Final   No results found for: "PLTCOUNTKUC", "LABPLAT", "POCPLA" RDW  Date Value Ref Range Status  08/12/2023 12.8 11.0 - 15.0 % Final  06/12/2018 15.8 (H) 12.3 - 15.4 % Final

## 2023-10-13 ENCOUNTER — Other Ambulatory Visit: Payer: Self-pay | Admitting: Family Medicine

## 2023-10-13 DIAGNOSIS — H9193 Unspecified hearing loss, bilateral: Secondary | ICD-10-CM

## 2023-10-17 ENCOUNTER — Telehealth: Payer: Self-pay

## 2023-10-17 NOTE — Telephone Encounter (Signed)
Copied from CRM [id number]. Topic: Referral - Question >> Oct 14, 2023 10:29 AM Hendricks Limes wrote: Reason for CRM: Office needs to verify insurance information. Please call them back [phone] ask for Afia

## 2023-11-03 ENCOUNTER — Ambulatory Visit: Payer: No Typology Code available for payment source | Admitting: Audiology

## 2023-11-05 ENCOUNTER — Other Ambulatory Visit: Payer: Self-pay | Admitting: Family Medicine

## 2023-12-11 DIAGNOSIS — T849XXA Unspecified complication of internal orthopedic prosthetic device, implant and graft, initial encounter: Secondary | ICD-10-CM | POA: Insufficient documentation

## 2023-12-14 ENCOUNTER — Telehealth: Payer: Self-pay

## 2023-12-14 DIAGNOSIS — E7800 Pure hypercholesterolemia, unspecified: Secondary | ICD-10-CM

## 2023-12-14 MED ORDER — ATORVASTATIN CALCIUM 20 MG PO TABS: 20.0000 mg | ORAL_TABLET | Freq: Every day | ORAL | 3 refills | Status: AC

## 2023-12-14 NOTE — Telephone Encounter (Signed)
Prescription Request  12/14/2023  LOV: 08/25/23  What is the name of the medication or equipment? atorvastatin (LIPITOR) 20 MG tablet [[id number]]   Have you contacted your pharmacy to request a refill? Yes   Which pharmacy would you like this sent to?  [id number] - [place], [place] - [address] AT [facility] [address] [place] [address] Phone: [phone] Fax: [phone]    Patient notified that their request is being sent to the clinical staff for review and that they should receive a response within 2 business days.   Please advise at [facility] [phone]

## 2024-03-13 ENCOUNTER — Other Ambulatory Visit: Payer: Self-pay

## 2024-03-13 NOTE — Telephone Encounter (Signed)
 Prescription Request  03/13/2024  LOV: 08/25/23  What is the name of the medication or equipment? metFORMIN  (GLUCOPHAGE -XR) 500 MG 24 hr tablet [[id number]]   Have you contacted your pharmacy to request a refill? Yes   Which pharmacy would you like this sent to?  [id number] - [place], [place] - [address] AT [facility] [address]  [place] [address] Phone: [phone] Fax: [phone]    Patient notified that their request is being sent to the clinical staff for review and that they should receive a response within 2 business days.   Please advise at [facility] [phone]

## 2024-03-14 ENCOUNTER — Other Ambulatory Visit: Payer: Self-pay

## 2024-03-14 MED ORDER — METFORMIN HCL ER 500 MG PO TB24: 500.0000 mg | ORAL_TABLET | Freq: Two times a day (BID) | ORAL | 1 refills | Status: DC

## 2024-03-14 NOTE — Telephone Encounter (Signed)
 Left a voicemail for him to call and schedule his 6 month check up wth [facility].  Provider to review Metformin  for refills since criteria not met for nurse triage to refill.

## 2024-03-17 ENCOUNTER — Other Ambulatory Visit: Payer: Self-pay | Admitting: Family Medicine

## 2024-03-19 NOTE — Telephone Encounter (Signed)
 Requested Prescriptions  Pending Prescriptions Disp Refills   meloxicam  (MOBIC ) 15 MG tablet [Pharmacy Med Name: MELOXICAM  15 MG TABLET] 90 tablet 0    Sig: TAKE 1 TABLET (15 MG TOTAL) BY MOUTH DAILY.     Analgesics:  COX2 Inhibitors Failed - 03/19/2024 11:55 AM      Failed - Manual Review: Labs are only required if the patient has taken medication for more than 8 weeks.      Failed - Valid encounter within last 12 months    Recent Outpatient Visits           6 months ago Rhinosinusitis   [place] [facility] Cheril Cork, Cisco Crest, MD   7 months ago Controlled type 2 diabetes mellitus without complication, without long-term current use of insulin (HCC)   [place] Pickard, Cisco Crest, MD   1 year ago Controlled type 2 diabetes mellitus without complication, without long-term current use of insulin (HCC)   [place] [facility] Austine Lefort, MD   1 year ago Strain of left hamstring, initial encounter   [place] [facility] Austine Lefort, MD   1 year ago Controlled type 2 diabetes mellitus without complication, without long-term current use of insulin [facility])   [place] [facility] Pickard, Cisco Crest, MD              Passed - HGB in normal range and within 360 days    Hemoglobin  Date Value Ref Range Status  08/12/2023 15.0 13.2 - 17.1 g/dL Final  16/08/9603 54.0 13.0 - 17.7 g/dL Final         Passed - Cr in normal range and within 360 days    Creat  Date Value Ref Range Status  08/12/2023 1.04 0.70 - 1.28 mg/dL Final   Creatinine, Urine  Date Value Ref Range Status  08/12/2023 122 20 - 320 mg/dL Final         Passed - HCT in normal range and within 360 days    HCT  Date Value Ref Range Status  08/12/2023 46.7 38.5 - 50.0 % Final   Hematocrit  Date Value Ref Range Status  06/12/2018 41.2 37.5 - 51.0 % Final         Passed - AST in normal range and within  360 days    AST  Date Value Ref Range Status  08/12/2023 13 10 - 35 U/L Final         Passed - ALT in normal range and within 360 days    ALT  Date Value Ref Range Status  08/12/2023 14 9 - 46 U/L Final         Passed - eGFR is 30 or above and within 360 days    GFR, Est African American  Date Value Ref Range Status  03/12/2021 77 > OR = 60 mL/min/1.68m2 Final   GFR, Est Non African American  Date Value Ref Range Status  03/12/2021 66 > OR = 60 mL/min/1.34m2 Final   eGFR  Date Value Ref Range Status  08/12/2023 74 > OR = 60 mL/min/1.83m2 Final         Passed - Patient is not pregnant

## 2024-03-29 ENCOUNTER — Encounter (HOSPITAL_COMMUNITY): Payer: Self-pay

## 2024-04-05 ENCOUNTER — Ambulatory Visit (INDEPENDENT_AMBULATORY_CARE_PROVIDER_SITE_OTHER): Admitting: Family Medicine

## 2024-04-05 ENCOUNTER — Encounter: Payer: Self-pay | Admitting: Family Medicine

## 2024-04-05 VITALS — BP 118/64 | HR 67 | Temp 97.9°F | Ht 66.0 in | Wt 212.8 lb

## 2024-04-05 DIAGNOSIS — E1169 Type 2 diabetes mellitus with other specified complication: Secondary | ICD-10-CM | POA: Diagnosis not present

## 2024-04-05 DIAGNOSIS — H9193 Unspecified hearing loss, bilateral: Secondary | ICD-10-CM

## 2024-04-05 DIAGNOSIS — Z7984 Long term (current) use of oral hypoglycemic drugs: Secondary | ICD-10-CM | POA: Diagnosis not present

## 2024-04-05 DIAGNOSIS — E119 Type 2 diabetes mellitus without complications: Secondary | ICD-10-CM

## 2024-04-05 NOTE — Progress Notes (Signed)
 Subjective:    Patient ID: Jacob Heath, male    DOB: 01/13/1946, 78 y.o.   MRN: [id number]  HPI Patient is a very pleasant 78 year old Caucasian male here today for checkup.  He has a history of type 2 diabetes mellitus, hypertension, hyperlipidemia, BPH, as well as ataxia.  His blood pressure today is well-controlled.  He denies any chest pain or shortness of breath or dyspnea on exertion.  He does report bilateral tinnitus and bilateral hearing loss.  He has a cerumen impaction in both ears.  My nurse was able to remove the cerumen impaction completely from the right ear with irrigation and lavage.  She was able to remove approximately 50% of the wax from the left ear.  His hearing has improved but he continues to report trouble hearing on a chronic basis and would like to see an audiologist to be evaluated for hearing aids.  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 Gracie Lav   Cutaneous skin tags 01/01/2014   Diabetes mellitus type 2 with complications (HCC)    Hiatal hernia    HLD (hyperlipidemia)    Hypertension 01/03/2014   Hypertrophy of prostate without urinary obstruction and other lower urinary tract symptoms (LUTS)    Internal hemorrhoids    Lipoma 01/01/2014   Lumbago 01/01/2014   OBSTRUCTIVE SLEEP APNEA 11/05/2009   npsg 1998:  AHI 23/hr, PLMS 124 with 8/hr with a/a On cpap    Osteoarthrosis, unspecified whether generalized or localized, lower leg    Unspecified arthropathy, lower leg    Wart 01/03/2014   Right 4th finger    Past Surgical History:  Procedure Laterality Date   COLONOSCOPY  07/14/2006   JOINT REPLACEMENT     bilateral knee replacement   KNEE SURGERY  08/1994 & 12/1996   x 2 Dr Jinger Mount   NASAL SEPTUM SURGERY  2001   Prepatellar bursectomy Right 1961   ROTATOR CUFF REPAIR     right shoulder   TESTICLE REMOVAL Right 2006   TOTAL KNEE ARTHROPLASTY Bilateral LT 03/2002 & RT 02/2003   Current Outpatient Medications on File  Prior to Visit  Medication Sig Dispense Refill   atorvastatin  (LIPITOR) 20 MG tablet Take 1 tablet (20 mg total) by mouth daily. 90 tablet 3   fluticasone  (FLONASE ) 50 MCG/ACT nasal spray Place 2 sprays into both nostrils daily. 16 g 6   meloxicam  (MOBIC ) 15 MG tablet TAKE 1 TABLET (15 MG TOTAL) BY MOUTH DAILY. 90 tablet 0   metFORMIN  (GLUCOPHAGE -XR) 500 MG 24 hr tablet Take 1 tablet (500 mg total) by mouth 2 (two) times daily with a meal. 180 tablet 1   pantoprazole  (PROTONIX ) 40 MG tablet TAKE 1 TABLET BY MOUTH EVERY DAY (Patient not taking: Reported on 08/25/2023) 30 tablet 1   sildenafil  (VIAGRA ) 100 MG tablet Take 0.5-1 tablets (50-100 mg total) by mouth daily as needed for erectile dysfunction. 5 tablet 11   tamsulosin  (FLOMAX ) 0.4 MG CAPS capsule TAKE 1 CAPSULE BY MOUTH EVERY DAY 90 capsule 1   UNABLE TO FIND CPAP - [facility]     No current facility-administered medications on file prior to visit.   Allergies  Allergen Reactions   Codeine     itching   Social History   Socioeconomic History   Marital status: Married    Spouse name: Not on file   Number of children: 2   Years of education: 12   Highest  education level: 12th grade  Occupational History   Occupation: Retired    Comment: Worked from post office  Tobacco Use   Smoking status: Former    Current packs/day: 0.00    Average packs/day: 2.0 packs/day for 6.0 years (12.0 ttl pk-yrs)    Types: Cigarettes    Start date: 11/22/1958    Quit date: 11/22/1964    Years since quitting: 59.4   Smokeless tobacco: Never  Vaping Use   Vaping status: Never Used  Substance and Sexual Activity   Alcohol use: Yes    Alcohol/week: 1.0 standard drink of alcohol    Types: 1 Cans of beer per week    Comment: rare   Drug use: No   Sexual activity: Yes  Other Topics Concern   Not on file  Social History Narrative   Patient lives at home with his wife Prentice Brochure)  and  His mother    Retired   [employer] school education   Right  handed   Social Drivers of Health   Financial Resource Strain: Low Risk  (04/01/2024)   Overall Financial Resource Strain (CARDIA)    Difficulty of Paying Living Expenses: Not very hard  Food Insecurity: No Food Insecurity (04/01/2024)   Hunger Vital Sign    Worried About Running Out of Food in the Last Year: Never true    Ran Out of Food in the Last Year: Never true  Transportation Needs: No Transportation Needs (04/01/2024)   PRAPARE - Administrator, Civil Service (Medical): No    Lack of Transportation (Non-Medical): No  Physical Activity: Not on file  Stress: No Stress Concern Present (04/01/2024)   [employer] of Occupational Health - Occupational Stress Questionnaire    Feeling of Stress : Only a little  Social Connections: Socially Integrated (04/01/2024)   Social Connection and Isolation Panel [NHANES]    Frequency of Communication with Friends and Family: More than three times a week    Frequency of Social Gatherings with Friends and Family: Once a week    Attends Religious Services: More than 4 times per year    Active Member of [employer] or Organizations: Yes    Attends Engineer, structural: Not on file    Marital Status: Married  Catering manager Violence: Not on file   Family History  Problem Relation Age of Onset   Allergies Sister    Heart disease Mother    Breast cancer Mother    Heart disease Brother    Heart disease Paternal Grandmother    Breast cancer Paternal Grandmother    Asthma Sister    Stroke Maternal Grandmother    Breast cancer Sister       Review of Systems  All other systems reviewed and are negative.      Objective:   Physical Exam Vitals reviewed.  Constitutional:      General: He is not in acute distress.    Appearance: He is well-developed. He is not diaphoretic.  HENT:     Head: Normocephalic and atraumatic.     Right Ear: External ear normal.     Left Ear: External ear normal.     Nose: Nose normal.      Mouth/Throat:     Pharynx: No oropharyngeal exudate.  Eyes:     General: No scleral icterus.       Right eye: No discharge.        Left eye: No discharge.     Conjunctiva/sclera: Conjunctivae normal.  Pupils: Pupils are equal, round, and reactive to light.  Neck:     Vascular: No JVD.     Trachea: No tracheal deviation.  Cardiovascular:     Rate and Rhythm: Normal rate and regular rhythm.     Heart sounds: Murmur heard.     No friction rub. No gallop.  Pulmonary:     Effort: Pulmonary effort is normal. No respiratory distress.     Breath sounds: Normal breath sounds. No stridor. No wheezing or rales.  Chest:     Chest wall: No tenderness.  Abdominal:     General: Bowel sounds are normal. There is no distension.     Palpations: Abdomen is soft. There is no mass.     Tenderness: There is no abdominal tenderness. There is no guarding or rebound.  Musculoskeletal:     Left knee: Bony tenderness present. Decreased range of motion. Tenderness present over the medial joint line.  Lymphadenopathy:     Cervical: No cervical adenopathy.  Skin:    General: Skin is warm and dry.     Coloration: Skin is not pale.     Findings: No erythema or rash.  Neurological:     Mental Status: He is alert and oriented to person, place, and time.     Cranial Nerves: No cranial nerve deficit.     Motor: No tremor, atrophy, abnormal muscle tone or seizure activity.     Coordination: Coordination abnormal.     Gait: Gait abnormal.     Deep Tendon Reflexes: Reflexes are normal and symmetric.           Assessment & Plan:  Controlled type 2 diabetes mellitus without complication, without long-term current use of insulin (HCC) - Plan: Hemoglobin A1c, CBC with Differential/Platelet, Comprehensive metabolic panel with GFR, Lipid panel, Microalbumin/Creatinine Ratio, Urine  Bilateral hearing loss, unspecified hearing loss type - Plan: Ambulatory referral to Audiology I am happy with his blood  pressure today.  Check hemoglobin A1c, CMP, fasting lipid panel, and a urine and protein creatinine ratio.  Goal LDL cholesterol is less than 100.  Goal A1c is less than 6.5.  Goal albumin to creatinine ratio is less than 30.  Consult audiology to evaluate for hearing aids.  Cerumen impaction was removed in the clinic with irrigation and lavage

## 2024-04-06 ENCOUNTER — Ambulatory Visit: Payer: Self-pay | Admitting: Family Medicine

## 2024-04-06 LAB — CBC WITH DIFFERENTIAL/PLATELET
Absolute Lymphocytes: 1856 {cells}/uL (ref 850–3900)
Absolute Monocytes: 614 {cells}/uL (ref 200–950)
Basophils Absolute: 21 {cells}/uL (ref 0–200)
Basophils Relative: 0.3 %
Eosinophils Absolute: 0 {cells}/uL — ABNORMAL LOW (ref 15–500)
Eosinophils Relative: 0 %
HCT: 44.1 % (ref 38.5–50.0)
Hemoglobin: 14.2 g/dL (ref 13.2–17.1)
MCH: 29.6 pg (ref 27.0–33.0)
MCHC: 32.2 g/dL (ref 32.0–36.0)
MCV: 92.1 fL (ref 80.0–100.0)
MPV: 10.8 fL (ref 7.5–12.5)
Monocytes Relative: 8.9 %
Neutro Abs: 4409 {cells}/uL (ref 1500–7800)
Neutrophils Relative %: 63.9 %
Platelets: 240 10*3/uL (ref 140–400)
RBC: 4.79 10*6/uL (ref 4.20–5.80)
RDW: 13.2 % (ref 11.0–15.0)
Total Lymphocyte: 26.9 %
WBC: 6.9 10*3/uL (ref 3.8–10.8)

## 2024-04-06 LAB — COMPREHENSIVE METABOLIC PANEL WITH GFR
AG Ratio: 1.7 (calc) (ref 1.0–2.5)
ALT: 12 U/L (ref 9–46)
AST: 13 U/L (ref 10–35)
Albumin: 4.3 g/dL (ref 3.6–5.1)
Alkaline phosphatase (APISO): 85 U/L (ref 35–144)
BUN: 19 mg/dL (ref 7–25)
CO2: 26 mmol/L (ref 20–32)
Calcium: 9.6 mg/dL (ref 8.6–10.3)
Chloride: 105 mmol/L (ref 98–110)
Creat: 1.07 mg/dL (ref 0.70–1.28)
Globulin: 2.5 g/dL (ref 1.9–3.7)
Glucose, Bld: 115 mg/dL — ABNORMAL HIGH (ref 65–99)
Potassium: 4.6 mmol/L (ref 3.5–5.3)
Sodium: 140 mmol/L (ref 135–146)
Total Bilirubin: 0.8 mg/dL (ref 0.2–1.2)
Total Protein: 6.8 g/dL (ref 6.1–8.1)
eGFR: 71 mL/min/{1.73_m2} (ref >=60)

## 2024-04-06 LAB — MICROALBUMIN / CREATININE URINE RATIO
Creatinine, Urine: 138 mg/dL (ref 20–320)
Microalb Creat Ratio: 3 mg/g{creat} (ref <30)
Microalb, Ur: 0.4 mg/dL

## 2024-04-06 LAB — LIPID PANEL
Cholesterol: 133 mg/dL (ref <200)
HDL: 52 mg/dL (ref >=40)
LDL Cholesterol (Calc): 62 mg/dL
Non-HDL Cholesterol (Calc): 81 mg/dL (ref <130)
Total CHOL/HDL Ratio: 2.6 (calc) (ref <5.0)
Triglycerides: 106 mg/dL (ref <150)

## 2024-04-06 LAB — HEMOGLOBIN A1C
Hgb A1c MFr Bld: 6.4 % — ABNORMAL HIGH (ref <5.7)
Mean Plasma Glucose: 137 mg/dL
eAG (mmol/L): 7.6 mmol/L

## 2024-05-07 ENCOUNTER — Ambulatory Visit: Attending: Family Medicine | Admitting: Audiologist

## 2024-05-07 DIAGNOSIS — H903 Sensorineural hearing loss, bilateral: Secondary | ICD-10-CM | POA: Insufficient documentation

## 2024-05-07 DIAGNOSIS — H9313 Tinnitus, bilateral: Secondary | ICD-10-CM | POA: Diagnosis present

## 2024-05-07 NOTE — Procedures (Signed)
  Outpatient Audiology and [facility] [address] [place], [place]  [address] [phone]  AUDIOLOGICAL  EVALUATION  NAME: ELIZAH MIERZWA     DOB:   Nov 09, 1946      MRN: [id number]                                                                                     DATE: 05/07/2024     REFERENT: Austine Lefort, MD STATUS: Outpatient DIAGNOSIS: Sensorineural Hearing Loss Bilateral    History: Marti was seen for an audiological evaluation due to tinnitus and difficulty hearing. He has had tinnitus since his thirties and its becoming very bothersome. He worked in a Paediatric nurse room with Nurse, mental health whirring for several decades. He also uses firearms recreationally. He is a right handed shooter. Gita Lamb cannot hear women at church. He struggles with the sound system.  Johari denies pain or pressure in either ear. Main symptom of concern if the tinnitus which sounds like summertime bugs.  Medical history shows diabetes with complication which is a risk for hearing loss.    Evaluation:  Otoscopy showed a clear view of the tympanic membranes, bilaterally Tympanometry results were consistent with normal middle ear function, bilaterally   Audiometric testing was completed using Conventional Audiometry techniques with insert earphones and supraural headphones. Test results are consistent with normal sloping to severe sensorineural hearing loss bilaterally. Speech Recognition Thresholds were obtained at 30dB HL in the right ear and at 50dB HL in the left ear. Word Recognition Testing was completed at  40dB SL and Jarvis scored 76% in each ear.    Results:  The test results were reviewed with Gita Lamb. He has a sloping normal hearing to severe sensorineural  hearing loss bilaterally. He needs hearing aids for both ears. Aids will help with the tinnitus.  Audiogram printed and provided to [employer] and handout on [employer] benefit.    Recommendations: Hearing  aids recommended for both ears. Patient given list of local hearing aid providers.  Annual audiometric testing recommended to monitor hearing loss for progression.    32 minutes spent testing and counseling on results.   If you have any questions please feel free to contact me at [phone].  Raynald Calkins Stalnaker Au.D.  Audiologist   05/07/2024  9:53 AM  Cc: Austine Lefort, MD

## 2024-05-16 ENCOUNTER — Ambulatory Visit

## 2024-05-16 VITALS — Ht 66.0 in | Wt 212.0 lb

## 2024-05-16 DIAGNOSIS — Z0000 Encounter for general adult medical examination without abnormal findings: Secondary | ICD-10-CM

## 2024-05-16 NOTE — Progress Notes (Signed)
 Subjective:   Jacob Heath is a 78 y.o. who presents for a Medicare Wellness preventive visit.  As a reminder, Annual Wellness Visits don't include a physical exam, and some assessments may be limited, especially if this visit is performed virtually. We may recommend an in-person follow-up visit with your provider if needed.  Visit Complete: Virtual I connected with  Jacob Heath on 05/16/24 by a audio enabled telemedicine application and verified that I am speaking with the correct person using two identifiers.  Patient Location: Home  Provider Location: Home Office  I discussed the limitations of evaluation and management by telemedicine. The patient expressed understanding and agreed to proceed.  Vital Signs: Because this visit was a virtual/telehealth visit, some criteria may be missing or patient reported. Any vitals not documented were not able to be obtained and vitals that have been documented are patient reported.  VideoDeclined- This patient declined Librarian, academic. Therefore the visit was completed with audio only.  Persons Participating in Visit: Patient.  AWV Questionnaire: No: Patient Medicare AWV questionnaire was not completed prior to this visit.  Cardiac Risk Factors include: advanced age (>48men, >25 women);male gender;hypertension;dyslipidemia;diabetes mellitus     Objective:    Today's Vitals   05/16/24 1020  Weight: 212 lb (96.2 kg)  Height: 5' 6 (1.676 m)   Body mass index is 34.22 kg/m.     05/16/2024   12:52 PM 03/12/2021    8:27 AM 01/26/2017    9:20 AM 07/28/2016   11:23 AM 01/28/2016   11:33 AM 10/21/2015   11:07 AM  Advanced Directives  Does Patient Have a Medical Advance Directive? No No No  No  No  No   Does patient want to make changes to medical advance directive?  Yes (MAU/Ambulatory/Procedural Areas - Information given)      Would patient like information on creating a medical advance directive? Yes  (MAU/Ambulatory/Procedural Areas - Information given)    Yes - Educational materials given  Yes - Educational materials given      Data saved with a previous flowsheet row definition    Current Medications (verified) Outpatient Encounter Medications as of 05/16/2024  Medication Sig   atorvastatin  (LIPITOR) 20 MG tablet Take 1 tablet (20 mg total) by mouth daily.   fluticasone  (FLONASE ) 50 MCG/ACT nasal spray Place 2 sprays into both nostrils daily.   meloxicam  (MOBIC ) 15 MG tablet TAKE 1 TABLET (15 MG TOTAL) BY MOUTH DAILY.   metFORMIN  (GLUCOPHAGE -XR) 500 MG 24 hr tablet Take 1 tablet (500 mg total) by mouth 2 (two) times daily with a meal.   sildenafil  (VIAGRA ) 100 MG tablet Take 0.5-1 tablets (50-100 mg total) by mouth daily as needed for erectile dysfunction.   tamsulosin  (FLOMAX ) 0.4 MG CAPS capsule TAKE 1 CAPSULE BY MOUTH EVERY DAY   UNABLE TO FIND CPAP - [facility]   pantoprazole  (PROTONIX ) 40 MG tablet TAKE 1 TABLET BY MOUTH EVERY DAY (Patient not taking: Reported on 05/16/2024)   No facility-administered encounter medications on file as of 05/16/2024.    Allergies (verified) Codeine   History: Past Medical History:  Diagnosis Date   Arthritis    Ataxia 01/01/2014   Dr. Onita   Cutaneous skin tags 01/01/2014   Diabetes mellitus type 2 with complications (HCC)    Hiatal hernia    HLD (hyperlipidemia)    Hypertension 01/03/2014   Hypertrophy of prostate without urinary obstruction and other lower urinary tract symptoms (LUTS)    Internal  hemorrhoids    Lipoma 01/01/2014   Lumbago 01/01/2014   OBSTRUCTIVE SLEEP APNEA 11/05/2009   npsg 1998:  AHI 23/hr, PLMS 124 with 8/hr with a/a On cpap    Osteoarthrosis, unspecified whether generalized or localized, lower leg    Unspecified arthropathy, lower leg    Wart 01/03/2014   Right 4th finger    Past Surgical History:  Procedure Laterality Date   COLONOSCOPY  07/14/2006   JOINT REPLACEMENT     bilateral knee replacement    KNEE SURGERY  08/1994 & 12/1996   x 2 Dr Jane   NASAL SEPTUM SURGERY  2001   Prepatellar bursectomy Right 1961   ROTATOR CUFF REPAIR     right shoulder   TESTICLE REMOVAL Right 2006   TOTAL KNEE ARTHROPLASTY Bilateral LT 03/2002 & RT 02/2003   Family History  Problem Relation Age of Onset   Heart disease Mother    Breast cancer Mother    Allergies Sister    Asthma Sister    Breast cancer Sister    Heart disease Brother    Stroke Maternal Grandmother    Heart disease Paternal Grandmother    Breast cancer Paternal Grandmother    Social History   Socioeconomic History   Marital status: Married    Spouse name: Not on file   Number of children: 2   Years of education: 12   Highest education level: 12th grade  Occupational History   Occupation: Retired    Comment: Worked from post office  Tobacco Use   Smoking status: Former    Current packs/day: 0.00    Average packs/day: 2.0 packs/day for 6.0 years (12.0 ttl pk-yrs)    Types: Cigarettes    Start date: 11/22/1958    Quit date: 11/22/1964    Years since quitting: 59.5   Smokeless tobacco: Never  Vaping Use   Vaping status: Never Used  Substance and Sexual Activity   Alcohol use: Yes    Alcohol/week: 1.0 standard drink of alcohol    Types: 1 Cans of beer per week    Comment: rare   Drug use: No   Sexual activity: Yes  Other Topics Concern   Not on file  Social History Narrative   Patient lives at home with his wife Jerilynn)  and  His mother    Retired   [employer] school education   Right handed   Social Drivers of Health   Financial Resource Strain: Low Risk  (05/16/2024)   Overall Financial Resource Strain (CARDIA)    Difficulty of Paying Living Expenses: Not hard at all  Food Insecurity: No Food Insecurity (05/16/2024)   Hunger Vital Sign    Worried About Running Out of Food in the Last Year: Never true    Ran Out of Food in the Last Year: Never true  Transportation Needs: No Transportation Needs (05/16/2024)    PRAPARE - Administrator, Civil Service (Medical): No    Lack of Transportation (Non-Medical): No  Physical Activity: Inactive (05/16/2024)   Exercise Vital Sign    Days of Exercise per Week: 0 days    Minutes of Exercise per Session: 0 min  Stress: No Stress Concern Present (05/16/2024)   [employer] of Occupational Health - Occupational Stress Questionnaire    Feeling of Stress: Only a little  Social Connections: Socially Integrated (05/16/2024)   Social Connection and Isolation Panel    Frequency of Communication with Friends and Family: More than three times a week  Frequency of Social Gatherings with Friends and Family: Once a week    Attends Religious Services: More than 4 times per year    Active Member of [employer] or Organizations: Yes    Attends Engineer, structural: More than 4 times per year    Marital Status: Married    Tobacco Counseling Counseling given: Not Answered    Clinical Intake:  Pre-visit preparation completed: Yes  Pain : No/denies pain     Diabetes: No  Lab Results  Component Value Date   HGBA1C 6.4 (H) 04/05/2024   HGBA1C 6.3 (H) 08/12/2023   HGBA1C 6.3 (H) 09/01/2022     How often do you need to have someone help you when you read instructions, pamphlets, or other written materials from your doctor or pharmacy?: 1 - Never  Interpreter Needed?: No  Information entered by :: Charmaine Bloodgood LPN   Activities of Daily Living     05/16/2024   10:21 AM  In your present state of health, do you have any difficulty performing the following activities:  Hearing? 1  Vision? 0  Difficulty concentrating or making decisions? 0  Walking or climbing stairs? 1  Dressing or bathing? 0  Doing errands, shopping? 0  Preparing Food and eating ? N  Using the Toilet? N  In the past six months, have you accidently leaked urine? N  Do you have problems with loss of bowel control? N  Managing your Medications? N  Managing your  Finances? N  Housekeeping or managing your Housekeeping? N    Patient Care Team: Duanne Butler DASEN, MD as PCP - General (Family Medicine) Jane Charleston, MD as Consulting Physician (Orthopedic Surgery) Chales Idol, MD as Consulting Physician (Urology) Clance, Francis HERO, MD as Consulting Physician (Pulmonary Disease) Livingston Rigg, MD (Inactive) as Consulting Physician (Dermatology) [facility], [facility]   I have updated your Care Teams any recent Medical Services you may have received from other providers in the past year.     Assessment:   This is a routine wellness examination for Leland.  Hearing/Vision screen Hearing Screening - Comments:: Some hearing loss; referred to audiology  Vision Screening - Comments:: Wears rx glasses - up to date with routine eye exams with [facility]    Goals Addressed             This Visit's Progress    Prevent falls   On track      Depression Screen     05/16/2024   12:51 PM 08/12/2023   10:14 AM 11/08/2022    8:14 AM 07/19/2022    2:30 PM 03/12/2021    8:24 AM 06/18/2019    8:24 AM 01/25/2019    8:08 AM  PHQ 2/9 Scores  PHQ - 2 Score 0 0 0 0 0 0 1  PHQ- 9 Score     3      Fall Risk     05/16/2024   12:53 PM 08/12/2023   10:14 AM 11/08/2022    8:14 AM 03/12/2021    8:24 AM 06/18/2019    8:24 AM  Fall Risk   Falls in the past year? 0 1 1 1  0   Number falls in past yr: 0 0 1 1   Injury with Fall? 0 0 1 0   Risk for fall due to : Impaired mobility;Impaired balance/gait History of fall(s);Impaired balance/gait;Orthopedic patient History of fall(s);Impaired balance/gait;Orthopedic patient Impaired balance/gait   Follow up Falls prevention discussed;Education provided;Falls evaluation completed Education  provided;Falls prevention discussed;Falls evaluation completed Education provided;Falls prevention discussed  Falls evaluation completed  Falls evaluation completed      Data saved with a previous  flowsheet row definition    MEDICARE RISK AT HOME:  Medicare Risk at Home Any stairs in or around the home?: Yes If so, are there any without handrails?: No Home free of loose throw rugs in walkways, pet beds, electrical cords, etc?: Yes Adequate lighting in your home to reduce risk of falls?: Yes Life alert?: No Use of a cane, walker or w/c?: Yes Grab bars in the bathroom?: Yes Shower chair or bench in shower?: No Elevated toilet seat or a handicapped toilet?: Yes  TIMED UP AND GO:  Was the test performed?  No  Cognitive Function: Declined/Normal: No cognitive concerns noted by patient or family. Patient alert, oriented, able to answer questions appropriately and recall recent events. No signs of memory loss or confusion.        Immunizations Immunization History  Administered Date(s) Administered   Fluad Quad(high Dose 65+) 08/26/2021, 08/31/2022   Influenza, High Dose Seasonal PF 08/30/2017, 08/22/2018, 08/13/2019, 08/13/2019   Influenza,inj,Quad PF,6+ Mos 01/21/2015, 07/29/2015, 07/28/2016   Influenza-Unspecified 10/03/2013   Moderna Sars-Covid-2 Vaccination 01/03/2020, 02/01/2020, 09/30/2020   PNEUMOCOCCAL CONJUGATE-20 03/12/2021   Pneumococcal Conjugate-13 07/29/2015   Pneumococcal Polysaccharide-23 01/01/2014   Td 07/11/1997   Tdap 10/04/2007, 08/23/2020   Zoster Recombinant(Shingrix) 03/14/2021, 05/17/2021   Zoster, Live 04/09/2014    Screening Tests Health Maintenance  Topic Date Due   OPHTHALMOLOGY EXAM  06/02/2021   COVID-19 Vaccine (4 - 2024-25 season) 07/24/2023   INFLUENZA VACCINE  06/22/2024   FOOT EXAM  08/11/2024   HEMOGLOBIN A1C  10/06/2024   Diabetic kidney evaluation - eGFR measurement  04/05/2025   Diabetic kidney evaluation - Urine ACR  04/05/2025   Medicare Annual Wellness (AWV)  05/16/2025   DTaP/Tdap/Td (4 - Td or Tdap) 08/23/2030   Pneumococcal Vaccine: 50+ Years  Completed   Hepatitis C Screening  Completed   Zoster Vaccines- Shingrix   Completed   Hepatitis B Vaccines  Aged Out   HPV VACCINES  Aged Out   Meningococcal B Vaccine  Aged Out   Colonoscopy  Discontinued    Health Maintenance  Health Maintenance Due  Topic Date Due   OPHTHALMOLOGY EXAM  06/02/2021   COVID-19 Vaccine (4 - 2024-25 season) 07/24/2023   Health Maintenance Items Addressed: Records requested for last diabetic eye exam   Additional Screening:  Vision Screening: Recommended annual ophthalmology exams for early detection of glaucoma and other disorders of the eye. Would you like a referral to an eye doctor? No    Dental Screening: Recommended annual dental exams for proper oral hygiene  Community Resource Referral / Chronic Care Management: CRR required this visit?  No   CCM required this visit?  No   Plan:    I have personally reviewed and noted the following in the patient's chart:   Medical and social history Use of alcohol, tobacco or illicit drugs  Current medications and supplements including opioid prescriptions. Patient is not currently taking opioid prescriptions. Functional ability and status Nutritional status Physical activity Advanced directives List of other physicians Hospitalizations, surgeries, and ER visits in previous 12 months Vitals Screenings to include cognitive, depression, and falls Referrals and appointments  In addition, I have reviewed and discussed with patient certain preventive protocols, quality metrics, and best practice recommendations. A written personalized care plan for preventive services as well as general preventive health  recommendations were provided to patient.   Lavelle Pfeiffer [place], [place]   [id number]/7974   After Visit Summary: (MyChart) Due to this being a telephonic visit, the after visit summary with patients personalized plan was offered to patient via MyChart   Notes: Please refer to Routing Comments.

## 2024-05-16 NOTE — Patient Instructions (Signed)
 [name] , Thank you for taking time out of your busy schedule to complete your Annual Wellness Visit with me. I enjoyed our conversation and look forward to speaking with you again next year. I, as well as your care team,  appreciate your ongoing commitment to your health goals. Please review the following plan we discussed and let me know if I can assist you in the future. Your Game plan/ To Do List    Follow up Visits: Next Medicare AWV with our clinical staff: In 1 year    Have you seen your provider in the last 6 months (3 months if uncontrolled diabetes)? Yes Next Office Visit with your provider: To be scheduled   Clinician Recommendations:  Aim for 30 minutes of exercise or brisk walking, 6-8 glasses of water, and 5 servings of fruits and vegetables each day.       This is a list of the screening recommended for you and due dates:  Health Maintenance  Topic Date Due   Eye exam for diabetics  06/02/2021   COVID-19 Vaccine (4 - 2024-25 season) 07/24/2023   Flu Shot  06/22/2024   Complete foot exam   08/11/2024   Hemoglobin A1C  10/06/2024   Yearly kidney function blood test for diabetes  04/05/2025   Yearly kidney health urinalysis for diabetes  04/05/2025   Medicare Annual Wellness Visit  05/16/2025   DTaP/Tdap/Td vaccine (4 - Td or Tdap) 08/23/2030   Pneumococcal Vaccine for age over 65  Completed   Hepatitis C Screening  Completed   Zoster (Shingles) Vaccine  Completed   Hepatitis B Vaccine  Aged Out   HPV Vaccine  Aged Out   Meningitis B Vaccine  Aged Out   Colon Cancer Screening  Discontinued    Advanced directives: (ACP Link)Information on Advanced Care Planning can be found at [place]  Secretary of [employer] [facility] Advance Health Care Directives Advance Health Care Directives. [email or web address]  Advance Care Planning is important because it:  [x]  Makes sure you receive the medical care that is consistent with your values, goals, and preferences  [x]  It provides guidance to your  family and loved ones and reduces their decisional burden about whether or not they are making the right decisions based on your wishes.  Follow the link provided in your after visit summary or read over the paperwork we have mailed to you to help you started getting your Advance Directives in place. If you need assistance in completing these, please reach out to us  so that we can help you!  See attachments for Preventive Care and Fall Prevention Tips.

## 2024-06-18 ENCOUNTER — Telehealth: Payer: Self-pay | Admitting: Family Medicine

## 2024-06-18 ENCOUNTER — Other Ambulatory Visit: Payer: Self-pay

## 2024-06-18 DIAGNOSIS — S76312A Strain of muscle, fascia and tendon of the posterior muscle group at thigh level, left thigh, initial encounter: Secondary | ICD-10-CM

## 2024-06-18 DIAGNOSIS — M542 Cervicalgia: Secondary | ICD-10-CM

## 2024-06-18 MED ORDER — MELOXICAM 15 MG PO TABS: 15.0000 mg | ORAL_TABLET | Freq: Every day | ORAL | 0 refills | Status: DC

## 2024-06-18 NOTE — Telephone Encounter (Signed)
 Prescription Request  06/18/2024  LOV: 04/05/2024  What is the name of the medication or equipment?   meloxicam  (MOBIC ) 15 MG tablet  **90 day script request**  Have you contacted your pharmacy to request a refill? Yes   Which pharmacy would you like this sent to?  [id number] - [place], [place] - [address] AT [facility] [address] [place] [address] Phone: [phone] Fax: [phone]    Patient notified that their request is being sent to the clinical staff for review and that they should receive a response within 2 business days.   Please advise pharmacist.

## 2024-09-04 ENCOUNTER — Ambulatory Visit: Admitting: Family Medicine

## 2024-09-04 VITALS — BP 128/58 | HR 62 | Temp 97.8°F | Resp 20 | Ht 66.0 in | Wt 208.0 lb

## 2024-09-04 DIAGNOSIS — Z7984 Long term (current) use of oral hypoglycemic drugs: Secondary | ICD-10-CM

## 2024-09-04 DIAGNOSIS — K317 Polyp of stomach and duodenum: Secondary | ICD-10-CM | POA: Insufficient documentation

## 2024-09-04 DIAGNOSIS — N400 Enlarged prostate without lower urinary tract symptoms: Secondary | ICD-10-CM | POA: Diagnosis not present

## 2024-09-04 DIAGNOSIS — K222 Esophageal obstruction: Secondary | ICD-10-CM | POA: Insufficient documentation

## 2024-09-04 DIAGNOSIS — E119 Type 2 diabetes mellitus without complications: Secondary | ICD-10-CM | POA: Diagnosis not present

## 2024-09-04 DIAGNOSIS — Z23 Encounter for immunization: Secondary | ICD-10-CM | POA: Diagnosis not present

## 2024-09-04 DIAGNOSIS — Z125 Encounter for screening for malignant neoplasm of prostate: Secondary | ICD-10-CM

## 2024-09-04 DIAGNOSIS — K219 Gastro-esophageal reflux disease without esophagitis: Secondary | ICD-10-CM | POA: Insufficient documentation

## 2024-09-04 MED ORDER — TADALAFIL 20 MG PO TABS: 10.0000 mg | ORAL_TABLET | ORAL | 11 refills | Status: AC | PRN

## 2024-09-04 NOTE — Progress Notes (Signed)
 Subjective:    Patient ID: Jacob Heath, male    DOB: 1946-08-06, 78 y.o.   MRN: [id number]  HPI Patient is a very pleasant 78 year old Caucasian male here today for checkup.  He has a history of type 2 diabetes mellitus, hypertension, hyperlipidemia, BPH, as well as ataxia.  Patient is here today for follow-up of his diabetes.  His hemoglobin A1c in May was well-controlled.  He denies any chest pain shortness of breath or dyspnea on exertion.  He denies any palpitations.  He denies any syncope or lightheadedness.  He is also due for prostate cancer screening.  His blood pressure today is well-controlled. 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 Onita   Cutaneous skin tags 01/01/2014   Diabetes mellitus type 2 with complications (HCC)    Hiatal hernia    HLD (hyperlipidemia)    Hypertension 01/03/2014   Hypertrophy of prostate without urinary obstruction and other lower urinary tract symptoms (LUTS)    Internal hemorrhoids    Lipoma 01/01/2014   Lumbago 01/01/2014   OBSTRUCTIVE SLEEP APNEA 11/05/2009   npsg 1998:  AHI 23/hr, PLMS 124 with 8/hr with a/a On cpap    Osteoarthrosis, unspecified whether generalized or localized, lower leg    Unspecified arthropathy, lower leg    Wart 01/03/2014   Right 4th finger    Past Surgical History:  Procedure Laterality Date   COLONOSCOPY  07/14/2006   JOINT REPLACEMENT     bilateral knee replacement   KNEE SURGERY  08/1994 & 12/1996   x 2 Dr Jane   NASAL SEPTUM SURGERY  2001   Prepatellar bursectomy Right 1961   ROTATOR CUFF REPAIR     right shoulder   TESTICLE REMOVAL Right 2006   TOTAL KNEE ARTHROPLASTY Bilateral LT 03/2002 & RT 02/2003   Current Outpatient Medications on File Prior to Visit  Medication Sig Dispense Refill   atorvastatin  (LIPITOR) 20 MG tablet Take 1 tablet (20 mg total) by mouth daily. 90 tablet 3   fluticasone  (FLONASE ) 50 MCG/ACT nasal spray Place 2 sprays into both nostrils  daily. 16 g 6   meloxicam  (MOBIC ) 15 MG tablet Take 1 tablet (15 mg total) by mouth daily. 90 tablet 0   metFORMIN  (GLUCOPHAGE -XR) 500 MG 24 hr tablet Take 1 tablet (500 mg total) by mouth 2 (two) times daily with a meal. 180 tablet 1   pantoprazole  (PROTONIX ) 40 MG tablet TAKE 1 TABLET BY MOUTH EVERY DAY (Patient not taking: Reported on 05/16/2024) 30 tablet 1   sildenafil  (VIAGRA ) 100 MG tablet Take 0.5-1 tablets (50-100 mg total) by mouth daily as needed for erectile dysfunction. 5 tablet 11   tamsulosin  (FLOMAX ) 0.4 MG CAPS capsule TAKE 1 CAPSULE BY MOUTH EVERY DAY 90 capsule 1   UNABLE TO FIND CPAP - [facility]     No current facility-administered medications on file prior to visit.   Allergies  Allergen Reactions   Codeine     itching   Social History   Socioeconomic History   Marital status: Married    Spouse name: Not on file   Number of children: 2   Years of education: 12   Highest education level: 12th grade  Occupational History   Occupation: Retired    Comment: Worked from post [employer]  Tobacco Use   Smoking status: Former    Current packs/day: 0.00    Average packs/day: 2.0 packs/day for 6.0 years (  12.0 ttl pk-yrs)    Types: Cigarettes    Start date: 11/22/1958    Quit date: 11/22/1964    Years since quitting: 59.8   Smokeless tobacco: Never  Vaping Use   Vaping status: Never Used  Substance and Sexual Activity   Alcohol use: Yes    Alcohol/week: 1.0 standard drink of alcohol    Types: 1 Cans of beer per week    Comment: rare   Drug use: No   Sexual activity: Yes  Other Topics Concern   Not on file  Social History Narrative   Patient lives at home with his wife Jerilynn)  and  His mother    Retired   [employer] school education   Right handed   Social Drivers of Health   Financial Resource Strain: Low Risk  (09/03/2024)   Overall Financial Resource Strain (CARDIA)    Difficulty of Paying Living Expenses: Not hard at all  Food Insecurity: No Food  Insecurity (09/03/2024)   Hunger Vital Sign    Worried About Running Out of Food in the Last Year: Never true    Ran Out of Food in the Last Year: Never true  Transportation Needs: No Transportation Needs (09/03/2024)   PRAPARE - Administrator, Civil Service (Medical): No    Lack of Transportation (Non-Medical): No  Physical Activity: Insufficiently Active (09/03/2024)   Exercise Vital Sign    Days of Exercise per Week: 1 day    Minutes of Exercise per Session: 20 min  Stress: No Stress Concern Present (09/03/2024)   [employer] of Occupational Health - Occupational Stress Questionnaire    Feeling of Stress: Only a little  Social Connections: Socially Integrated (09/03/2024)   Social Connection and Isolation Panel    Frequency of Communication with Friends and Family: More than three times a week    Frequency of Social Gatherings with Friends and Family: More than three times a week    Attends Religious Services: More than 4 times per year    Active Member of [employer] or Organizations: Yes    Attends Engineer, structural: More than 4 times per year    Marital Status: Married  Catering manager Violence: Not At Risk (05/16/2024)   Humiliation, Afraid, Rape, and Kick questionnaire    Fear of Current or Ex-Partner: No    Emotionally Abused: No    Physically Abused: No    Sexually Abused: No   Family History  Problem Relation Age of Onset   Heart disease Mother    Breast cancer Mother    Allergies Sister    Asthma Sister    Breast cancer Sister    Heart disease Brother    Stroke Maternal Grandmother    Heart disease Paternal Grandmother    Breast cancer Paternal Grandmother       Review of Systems  All other systems reviewed and are negative.      Objective:   Physical Exam Vitals reviewed.  Constitutional:      General: He is not in acute distress.    Appearance: He is well-developed. He is not diaphoretic.  HENT:     Head: Normocephalic and  atraumatic.     Right Ear: External ear normal.     Left Ear: External ear normal.     Nose: Nose normal.     Mouth/Throat:     Pharynx: No oropharyngeal exudate.  Eyes:     General: No scleral icterus.  Right eye: No discharge.        Left eye: No discharge.     Conjunctiva/sclera: Conjunctivae normal.     Pupils: Pupils are equal, round, and reactive to light.  Neck:     Vascular: No JVD.     Trachea: No tracheal deviation.  Cardiovascular:     Rate and Rhythm: Normal rate and regular rhythm.     Heart sounds: Murmur heard.     No friction rub. No gallop.  Pulmonary:     Effort: Pulmonary effort is normal. No respiratory distress.     Breath sounds: Normal breath sounds. No stridor. No wheezing or rales.  Chest:     Chest wall: No tenderness.  Abdominal:     General: Bowel sounds are normal. There is no distension.     Palpations: Abdomen is soft. There is no mass.     Tenderness: There is no abdominal tenderness. There is no guarding or rebound.  Musculoskeletal:     Left knee: Bony tenderness present. Decreased range of motion. Tenderness present over the medial joint line.  Lymphadenopathy:     Cervical: No cervical adenopathy.  Skin:    General: Skin is warm and dry.     Coloration: Skin is not pale.     Findings: No erythema or rash.  Neurological:     Mental Status: He is alert and oriented to person, place, and time.     Cranial Nerves: No cranial nerve deficit.     Motor: No tremor, atrophy, abnormal muscle tone or seizure activity.     Coordination: Coordination abnormal.     Gait: Gait abnormal.     Deep Tendon Reflexes: Reflexes are normal and symmetric.           Assessment & Plan:  Prostate cancer screening - Plan: PSA  Benign prostatic hyperplasia, unspecified whether lower urinary tract symptoms present - Plan: PSA  Controlled type 2 diabetes mellitus without complication, without long-term current use of insulin (HCC) - Plan: Hemoglobin  A1c, CBC with Differential/Platelet, Comprehensive metabolic panel with GFR, Lipid panel, Flu vaccine HIGH DOSE PF(Fluzone Trivalent)  Flu vaccine need - Plan: Flu vaccine HIGH DOSE PF(Fluzone Trivalent) I am very happy with his blood pressure.  I will check a PSA to screen for prostate cancer.  Patient received his flu shot today.  I will check a CBC a CMP and A1c and a lipid panel.  I would like his LDL cholesterol to be well below 100 and his hemoglobin A1c to be below 6.5.

## 2024-09-06 ENCOUNTER — Ambulatory Visit: Payer: Self-pay | Admitting: Family Medicine

## 2024-09-08 LAB — COMPREHENSIVE METABOLIC PANEL WITH GFR
AG Ratio: 1.6 (calc) (ref 1.0–2.5)
ALT: 13 U/L (ref 9–46)
AST: 14 U/L (ref 10–35)
Albumin: 4.5 g/dL (ref 3.6–5.1)
Alkaline phosphatase (APISO): 90 U/L (ref 35–144)
BUN: 19 mg/dL (ref 7–25)
CO2: 27 mmol/L (ref 20–32)
Calcium: 9.8 mg/dL (ref 8.6–10.3)
Chloride: 105 mmol/L (ref 98–110)
Creat: 1.13 mg/dL (ref 0.70–1.28)
Globulin: 2.8 g/dL (ref 1.9–3.7)
Glucose, Bld: 118 mg/dL — ABNORMAL HIGH (ref 65–99)
Potassium: 4.6 mmol/L (ref 3.5–5.3)
Sodium: 140 mmol/L (ref 135–146)
Total Bilirubin: 0.9 mg/dL (ref 0.2–1.2)
Total Protein: 7.3 g/dL (ref 6.1–8.1)
eGFR: 67 mL/min/1.73m2 (ref >=60)

## 2024-09-08 LAB — CBC WITH DIFFERENTIAL/PLATELET
Absolute Lymphocytes: 2405 {cells}/uL (ref 850–3900)
Absolute Monocytes: 503 {cells}/uL (ref 200–950)
Basophils Absolute: 27 {cells}/uL (ref 0–200)
Basophils Relative: 0.4 %
Eosinophils Absolute: 147 {cells}/uL (ref 15–500)
Eosinophils Relative: 2.2 %
HCT: 45.9 % (ref 38.5–50.0)
Hemoglobin: 15.1 g/dL (ref 13.2–17.1)
MCH: 30 pg (ref 27.0–33.0)
MCHC: 32.9 g/dL (ref 32.0–36.0)
MCV: 91.1 fL (ref 80.0–100.0)
MPV: 11.1 fL (ref 7.5–12.5)
Monocytes Relative: 7.5 %
Neutro Abs: 3618 {cells}/uL (ref 1500–7800)
Neutrophils Relative %: 54 %
Platelets: 236 Thousand/uL (ref 140–400)
RBC: 5.04 Million/uL (ref 4.20–5.80)
RDW: 13.5 % (ref 11.0–15.0)
Total Lymphocyte: 35.9 %
WBC: 6.7 Thousand/uL (ref 3.8–10.8)

## 2024-09-08 LAB — LIPID PANEL
Cholesterol: 140 mg/dL (ref <200)
HDL: 50 mg/dL (ref >=40)
LDL Cholesterol (Calc): 70 mg/dL
Non-HDL Cholesterol (Calc): 90 mg/dL (ref <130)
Total CHOL/HDL Ratio: 2.8 (calc) (ref <5.0)
Triglycerides: 110 mg/dL (ref <150)

## 2024-09-08 LAB — PSA: PSA: 1.76 ng/mL (ref <=4.00)

## 2024-09-08 LAB — HEMOGLOBIN A1C
Hgb A1c MFr Bld: 6.2 % — ABNORMAL HIGH (ref <5.7)
Mean Plasma Glucose: 131 mg/dL
eAG (mmol/L): 7.3 mmol/L

## 2024-09-19 ENCOUNTER — Other Ambulatory Visit: Payer: Self-pay | Admitting: Family Medicine

## 2024-09-19 DIAGNOSIS — S76312A Strain of muscle, fascia and tendon of the posterior muscle group at thigh level, left thigh, initial encounter: Secondary | ICD-10-CM

## 2024-09-19 DIAGNOSIS — M542 Cervicalgia: Secondary | ICD-10-CM

## 2024-09-20 NOTE — Telephone Encounter (Signed)
 Requested Prescriptions  Pending Prescriptions Disp Refills   meloxicam  (MOBIC ) 15 MG tablet [Pharmacy Med Name: MELOXICAM  15 MG TABLET] 90 tablet 0    Sig: TAKE 1 TABLET (15 MG TOTAL) BY MOUTH DAILY.     Analgesics:  COX2 Inhibitors Failed - 09/20/2024  2:27 PM      Failed - Manual Review: Labs are only required if the patient has taken medication for more than 8 weeks.      Passed - HGB in normal range and within 360 days    Hemoglobin  Date Value Ref Range Status  09/04/2024 15.1 13.2 - 17.1 g/dL Final  92/77/7980 86.9 13.0 - 17.7 g/dL Final         Passed - Cr in normal range and within 360 days    Creat  Date Value Ref Range Status  09/04/2024 1.13 0.70 - 1.28 mg/dL Final   Creatinine, Urine  Date Value Ref Range Status  04/05/2024 138 20 - 320 mg/dL Final         Passed - HCT in normal range and within 360 days    HCT  Date Value Ref Range Status  09/04/2024 45.9 38.5 - 50.0 % Final   Hematocrit  Date Value Ref Range Status  06/12/2018 41.2 37.5 - 51.0 % Final         Passed - AST in normal range and within 360 days    AST  Date Value Ref Range Status  09/04/2024 14 10 - 35 U/L Final         Passed - ALT in normal range and within 360 days    ALT  Date Value Ref Range Status  09/04/2024 13 9 - 46 U/L Final         Passed - eGFR is 30 or above and within 360 days    GFR, Est African American  Date Value Ref Range Status  03/12/2021 77 > OR = 60 mL/min/1.71m2 Final   GFR, Est Non African American  Date Value Ref Range Status  03/12/2021 66 > OR = 60 mL/min/1.68m2 Final   eGFR  Date Value Ref Range Status  09/04/2024 67 > OR = 60 mL/min/1.55m2 Final         Passed - Patient is not pregnant      Passed - Valid encounter within last 12 months    Recent Outpatient Visits           2 weeks ago Prostate cancer screening   [place] [facility] Duanne Butler DASEN, MD   5 months ago Controlled type 2 diabetes mellitus without  complication, without long-term current use of insulin [facility])   [place] [facility] Pickard, Butler DASEN, MD   1 year ago Rhinosinusitis   [place] [facility] Duanne Butler DASEN, MD   1 year ago Controlled type 2 diabetes mellitus without complication, without long-term current use of insulin [facility])   [place] [facility] Duanne Butler DASEN, MD   1 year ago Controlled type 2 diabetes mellitus without complication, without long-term current use of insulin [facility])   [place] [facility] Pickard, Butler DASEN, MD

## 2024-11-16 ENCOUNTER — Other Ambulatory Visit: Payer: Self-pay

## 2024-11-16 ENCOUNTER — Telehealth: Payer: Self-pay

## 2024-11-16 MED ORDER — METFORMIN HCL ER 500 MG PO TB24: 500.0000 mg | ORAL_TABLET | Freq: Two times a day (BID) | ORAL | 1 refills | Status: AC

## 2024-11-16 NOTE — Telephone Encounter (Signed)
 Prescription Request  11/16/2024  LOV: 09/04/24  What is the name of the medication or equipment? metFORMIN  (GLUCOPHAGE -XR) 500 MG 24 hr tablet [[id number]]   Have you contacted your pharmacy to request a refill? Yes   Which pharmacy would you like this sent to?  [id number] - [place], [place] - [address] AT [facility] [address] [place] [address] Phone: [phone] Fax: [phone]    Patient notified that their request is being sent to the clinical staff for review and that they should receive a response within 2 business days.   Please advise at [facility] [phone]

## 2025-05-22 ENCOUNTER — Ambulatory Visit
